# Patient Record
Sex: Male | Born: 1940 | Race: White | Hispanic: No | Marital: Married | Smoking: Former smoker
Health system: Southern US, Community
[De-identification: ages and names within clinical notes are randomized; demographics above are authoritative.]

## PROBLEM LIST (undated history)

## (undated) DIAGNOSIS — G43009 Migraine without aura, not intractable, without status migrainosus: Secondary | ICD-10-CM

## (undated) DIAGNOSIS — J189 Pneumonia, unspecified organism: Secondary | ICD-10-CM

## (undated) DIAGNOSIS — M810 Age-related osteoporosis without current pathological fracture: Secondary | ICD-10-CM

## (undated) DIAGNOSIS — K589 Irritable bowel syndrome without diarrhea: Secondary | ICD-10-CM

## (undated) DIAGNOSIS — Z8489 Family history of other specified conditions: Secondary | ICD-10-CM

## (undated) DIAGNOSIS — N183 Chronic kidney disease, stage 3 unspecified: Secondary | ICD-10-CM

## (undated) DIAGNOSIS — R000 Tachycardia, unspecified: Secondary | ICD-10-CM

## (undated) DIAGNOSIS — I10 Essential (primary) hypertension: Secondary | ICD-10-CM

## (undated) DIAGNOSIS — G4730 Sleep apnea, unspecified: Secondary | ICD-10-CM

## (undated) DIAGNOSIS — M1990 Unspecified osteoarthritis, unspecified site: Secondary | ICD-10-CM

## (undated) DIAGNOSIS — N400 Enlarged prostate without lower urinary tract symptoms: Secondary | ICD-10-CM

## (undated) DIAGNOSIS — G894 Chronic pain syndrome: Secondary | ICD-10-CM

## (undated) HISTORY — PX: SINUS EXPLORATION: SHX5214

## (undated) HISTORY — PX: CATARACT EXTRACTION: SUR2

## (undated) HISTORY — DX: Irritable bowel syndrome, unspecified: K58.9

## (undated) HISTORY — PX: MAXILLARY SINUS LIFT: SHX5206

## (undated) HISTORY — DX: Chronic pain syndrome: G89.4

## (undated) HISTORY — PX: HEMORRHOID SURGERY: SHX153

## (undated) HISTORY — DX: Migraine without aura, not intractable, without status migrainosus: G43.009

## (undated) HISTORY — DX: Age-related osteoporosis without current pathological fracture: M81.0

## (undated) HISTORY — PX: ELBOW FRACTURE SURGERY: SHX616

## (undated) HISTORY — DX: Unspecified osteoarthritis, unspecified site: M19.90

## (undated) HISTORY — PX: CERVICAL FUSION: SHX112

## (undated) HISTORY — PX: SPINE SURGERY: SHX786

## (undated) HISTORY — PX: OTHER SURGICAL HISTORY: SHX169

---
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
## 1998-01-17 ENCOUNTER — Ambulatory Visit (HOSPITAL_COMMUNITY): Admission: RE | Admit: 1998-01-17 | Discharge: 1998-01-17 | Payer: Self-pay | Admitting: *Deleted

## 1998-02-21 ENCOUNTER — Ambulatory Visit (HOSPITAL_COMMUNITY): Admission: RE | Admit: 1998-02-21 | Discharge: 1998-02-21 | Payer: Self-pay | Admitting: *Deleted

## 1999-03-13 ENCOUNTER — Encounter: Admission: RE | Admit: 1999-03-13 | Discharge: 1999-06-11 | Payer: Self-pay | Admitting: Anesthesiology

## 1999-08-03 ENCOUNTER — Encounter: Payer: Self-pay | Admitting: Specialist

## 1999-08-03 ENCOUNTER — Ambulatory Visit (HOSPITAL_COMMUNITY): Admission: RE | Admit: 1999-08-03 | Discharge: 1999-08-03 | Payer: Self-pay | Admitting: Specialist

## 1999-09-22 ENCOUNTER — Encounter: Payer: Self-pay | Admitting: Specialist

## 1999-09-29 ENCOUNTER — Encounter (INDEPENDENT_AMBULATORY_CARE_PROVIDER_SITE_OTHER): Payer: Self-pay | Admitting: Specialist

## 1999-09-29 ENCOUNTER — Inpatient Hospital Stay (HOSPITAL_COMMUNITY): Admission: RE | Admit: 1999-09-29 | Discharge: 1999-10-03 | Payer: Self-pay | Admitting: Specialist

## 1999-09-29 ENCOUNTER — Encounter: Payer: Self-pay | Admitting: Specialist

## 2002-05-10 ENCOUNTER — Encounter: Admission: RE | Admit: 2002-05-10 | Discharge: 2002-05-10 | Payer: Self-pay | Admitting: Family Medicine

## 2002-05-10 ENCOUNTER — Encounter: Payer: Self-pay | Admitting: Family Medicine

## 2002-12-16 ENCOUNTER — Encounter: Payer: Self-pay | Admitting: Emergency Medicine

## 2002-12-16 ENCOUNTER — Ambulatory Visit (HOSPITAL_COMMUNITY): Admission: RE | Admit: 2002-12-16 | Discharge: 2002-12-16 | Payer: Self-pay | Admitting: Emergency Medicine

## 2002-12-17 ENCOUNTER — Emergency Department (HOSPITAL_COMMUNITY): Admission: EM | Admit: 2002-12-17 | Discharge: 2002-12-17 | Payer: Self-pay | Admitting: Emergency Medicine

## 2002-12-22 ENCOUNTER — Encounter: Payer: Self-pay | Admitting: Orthopedic Surgery

## 2002-12-23 ENCOUNTER — Encounter: Payer: Self-pay | Admitting: Orthopedic Surgery

## 2002-12-23 ENCOUNTER — Observation Stay (HOSPITAL_COMMUNITY): Admission: RE | Admit: 2002-12-23 | Discharge: 2002-12-24 | Payer: Self-pay | Admitting: Orthopedic Surgery

## 2002-12-24 ENCOUNTER — Ambulatory Visit (HOSPITAL_COMMUNITY): Admission: RE | Admit: 2002-12-24 | Discharge: 2002-12-25 | Payer: Self-pay | Admitting: Otolaryngology

## 2003-05-09 ENCOUNTER — Encounter: Payer: Self-pay | Admitting: Family Medicine

## 2003-05-09 ENCOUNTER — Encounter: Admission: RE | Admit: 2003-05-09 | Discharge: 2003-05-09 | Payer: Self-pay | Admitting: Family Medicine

## 2003-06-17 ENCOUNTER — Encounter: Admission: RE | Admit: 2003-06-17 | Discharge: 2003-06-17 | Payer: Self-pay | Admitting: Orthopaedic Surgery

## 2003-06-17 ENCOUNTER — Encounter: Payer: Self-pay | Admitting: Orthopaedic Surgery

## 2003-09-09 ENCOUNTER — Ambulatory Visit (HOSPITAL_COMMUNITY): Admission: RE | Admit: 2003-09-09 | Discharge: 2003-09-10 | Payer: Self-pay | Admitting: Orthopaedic Surgery

## 2004-12-05 ENCOUNTER — Ambulatory Visit (HOSPITAL_COMMUNITY): Admission: RE | Admit: 2004-12-05 | Discharge: 2004-12-05 | Payer: Self-pay | Admitting: Gastroenterology

## 2006-03-25 ENCOUNTER — Ambulatory Visit (HOSPITAL_BASED_OUTPATIENT_CLINIC_OR_DEPARTMENT_OTHER): Admission: RE | Admit: 2006-03-25 | Discharge: 2006-03-25 | Payer: Self-pay | Admitting: Otolaryngology

## 2006-03-25 ENCOUNTER — Encounter (INDEPENDENT_AMBULATORY_CARE_PROVIDER_SITE_OTHER): Payer: Self-pay | Admitting: Specialist

## 2006-08-20 ENCOUNTER — Ambulatory Visit (HOSPITAL_BASED_OUTPATIENT_CLINIC_OR_DEPARTMENT_OTHER): Admission: RE | Admit: 2006-08-20 | Discharge: 2006-08-20 | Payer: Self-pay | Admitting: Otolaryngology

## 2006-09-01 ENCOUNTER — Ambulatory Visit: Payer: Self-pay | Admitting: Internal Medicine

## 2007-07-14 IMAGING — CR DG CHEST 2V
2 series · 2 of 2 positions shown · non-contrast
Comparison: [DATE].

CLINICAL DATA: Hemorrhoids.  Preoperative chest. 
 CHEST ? 2 VIEW:

[view not recorded (1 of 2)]
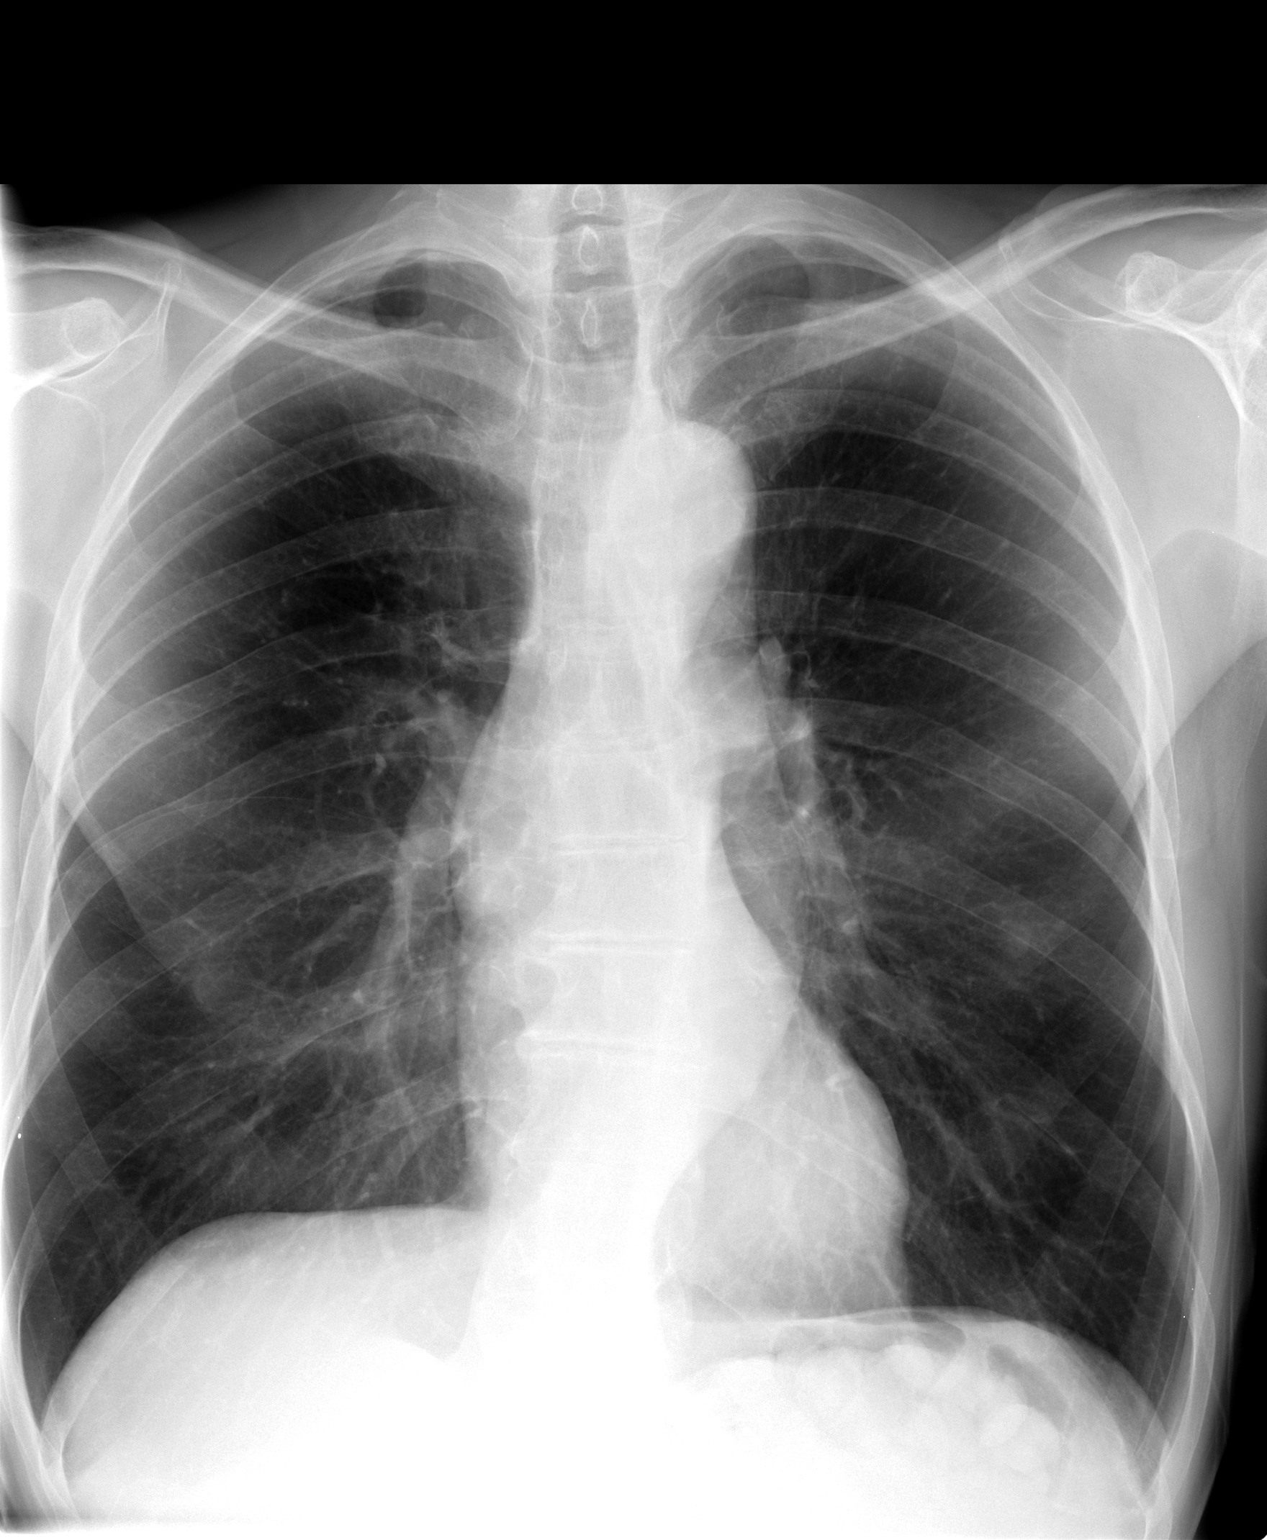

[view not recorded (2 of 2)]
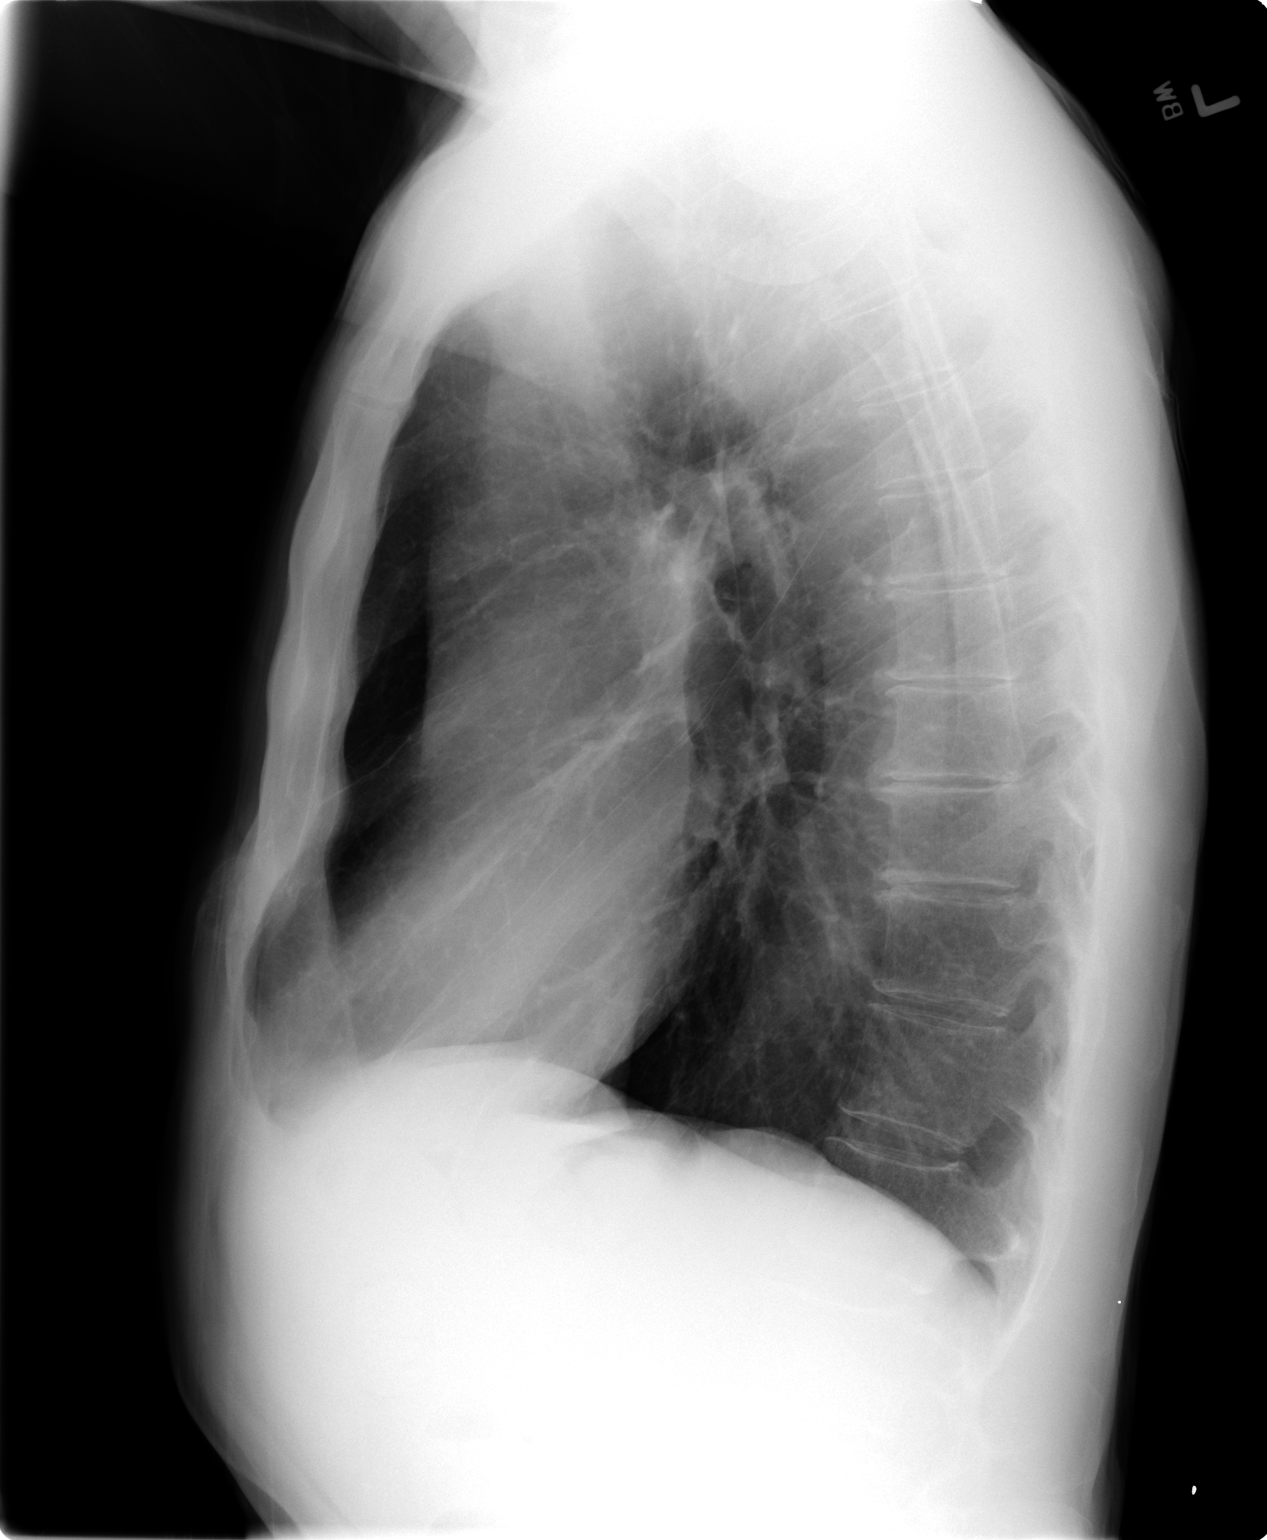

[2 of 2 positions shown; findings below may reference images not displayed]

FINDINGS: There are nipple shadows overlying the mid to lower lung zones.  The lungs are free of infiltrates.  The heart and mediastinal structures are normal.  There has been a previous anterior cervical fusion.
IMPRESSION: No evidence for active chest disease.

## 2007-07-15 ENCOUNTER — Encounter (INDEPENDENT_AMBULATORY_CARE_PROVIDER_SITE_OTHER): Payer: Self-pay | Admitting: General Surgery

## 2007-07-15 ENCOUNTER — Ambulatory Visit (HOSPITAL_COMMUNITY): Admission: RE | Admit: 2007-07-15 | Discharge: 2007-07-15 | Payer: Self-pay | Admitting: General Surgery

## 2009-05-14 ENCOUNTER — Encounter: Admission: RE | Admit: 2009-05-14 | Discharge: 2009-05-14 | Payer: Self-pay | Admitting: Orthopaedic Surgery

## 2011-01-16 NOTE — Op Note (Signed)
NAME:  Jesus Alvarez, Jesus Alvarez             ACCOUNT NO.:  [id number]   MEDICAL RECORD NO.:  [id number]          PATIENT TYPE:  AMB   LOCATION:  DAY                          FACILITY:  [facility]   PHYSICIAN:  Timothy E. Earlene Plater, M.D. DATE OF BIRTH:  08-14-1941   DATE OF PROCEDURE:  07/15/2007  DATE OF DISCHARGE:                               OPERATIVE REPORT   PREOPERATIVE DIAGNOSIS:  Prolapsing hemorrhoids.   PROCEDURE:  PPH hemorrhoidectomy.   SURGEON:  Timothy E. Earlene Plater, M.D.   ANESTHESIA:  General.   Mr. Merryfield is 49, otherwise healthy except for chronic pain syndrome  and use of narcotics.  He does have some difficulty with bowels, more  diarrhea than constipation, and he has developed prolapsing hemorrhoids  of a fourth-degree nature.  He has been seen and evaluated in the office  and offered various forms of treatment, and he is an ideal candidate for  PTH hemorrhoidectomy.  At the appropriate time, he has chosen now to  proceed with that operation.  He has been carefully informed.  He is  seen today and identified and the permit signed.   He is taken to the operating room and placed supine.  General LMA  anesthesia provided.  He is placed in lithotomy.  Perianal area  inspected, prepped and draped in the usual fashion.  Hemorrhoids were  prolapsing.  There were gently reduced.  The anus was dilated.  The anus  was injected round and about with Marcaine, epinephrine and Wydase, a  total 20 mL used.  This all massaged in well.  The anus dilated nicely.  The working operating anoscope placed and was easily manipulated.  A  pursestring suture of 2-0 Prolene was placed beginning anteriorly and  around the entirety of the rectal mucosa of a mucosal thickness only  without complication.  The anoscope removed.  The integrity of the  pursestring suture checked and then opened widely.  With the Ethicon  clear plastic operating anoscope in place, the tails of the suture were  brought  exteriorly.  The opened PTH stapling device was gently inserted  through the plastic anoscope into the rectal canal, and the head of the  stapling device popped through the pursestring suture which was then  gently tied around the shaft of the PTH stapling.  With proper traction  and positioning, PTH stapler was closed, at the same time, gentle  traction on the sutures, and it was fired, held, opened, held and then  removed.  There was one bleeding area anterior to this that was suture-  ligated with a 4-0 Vicryl immediately, and all bleeding was stopped.  The area was observed for a full 10 minutes, and no other areas were  bleeding.  The suture line was about 1-cm proximal to the dentate line.  There were no other complications.  After careful observation, a Gelfoam  pack was placed in the rectal canal and external dressing.  All counts  correct.  He tolerated it well and was removed to the recovery room in  good condition.   Written and verbal instructions given including Percocet #36, and  he  will be followed as an outpatient.     Timothy E. Earlene Plater, M.D.  Electronically Signed    TED/MEDQ  D:  07/15/2007  T:  07/16/2007  Job:  [id number]

## 2011-01-19 NOTE — Op Note (Signed)
NAME:  ZAKEE, DEERMAN NO.:  [id number]   MEDICAL RECORD NO.:  [id number]                   PATIENT TYPE:  OIB   LOCATION:  5731                                 FACILITY:  [facility]   PHYSICIAN:  Zola Button T. Lazarus Salines, M.D.              DATE OF BIRTH:  November 26, 1940   DATE OF PROCEDURE:  12/24/2002  DATE OF DISCHARGE:                                 OPERATIVE REPORT   PREOPERATIVE DIAGNOSIS:  Depressed right trimalar zygomatic fracture.   POSTOPERATIVE DIAGNOSIS:  Depressed right trimalar zygomatic fracture.   PROCEDURE:  Open reduction and internal fixation, right zygomatic fracture.   SURGEON:  Gloris Manchester. Lazarus Salines, M.D.   ANESTHESIA:  General orotracheal.   ESTIMATED BLOOD LOSS:  Minimal.   COMPLICATIONS:  None.   FINDINGS:  A posteriorly-depressed and counterclockwise-rotated fracture of  the right zygoma with fractures at the right frontozygomatic suture,  depressed fracture at the zygomatic arch, and a comminuted fracture along  the infraorbital rim.  The fracture was readily reduced and stable with  plate at the frontozygomatic suture and along the infraorbital rim.   DESCRIPTION OF PROCEDURE:  With the patient in a comfortable supine  position, general orotracheal anesthesia was induced without difficulty.  At  an appropriate level, the patient was placed in a slight reverse  Trendelenburg.  The head was rotated to the right for access to the right  eye region.  Xylocaine 1% with 1:100,000 epinephrine, 6 mL total, was  infiltrated into the right frontozygomatic suture region and also into the  lower lid and infraorbital rim region for intraoperative hemostasis.  Several minutes were allowed for this to take effect.   Taking care to avoid the visual axis of the eye, a 6-0 nylon temporary  tarsorrhaphy Frost stitch was applied in the standard fashion.   Beginning at the right frontozygomatic suture line, a 2 cm incision was  executed in the lateral  brow, beveled with the hair follicles, and carried  down through skin, orbicularis oculi muscle, and periosteum.  The fracture  was palpated and the incision was carried to the periosteum above and below  the fracture line.  The periosteum was elevated toward orbit and backed into  the temporal fossa.   A subciliary approach was planned to the lower lid.  The sharp incision was  made from just lateral to the medial inferior punctum to the lateral corner  of the eye and then a small (5 mm) extension into one of the wrinkles was  sharply executed.  Tenotomy scissors were used to elevate the skin from the  lid for approximately 4-5 mm vertical height.  The orbicularis oculi muscle  was penetrated laterally and a submuscular tunnel was executed.  A skin-  muscle flap was separated and retracted downward.  The dissection was  carried along the orbital septum with no exposure of periorbital fat.  This  was carried down to the  infraorbital rim.  There was a palpable-step-off.  The periosteum at the infraorbital rim was incised on both sides of the step-  off and the periosteum elevated down onto the face of the maxilla for access  to the bone.   At this point, the Joker elevator was passed near the temporal fossa and  secured bluntly under the zygomatic arch.  With lateral and anterior  traction, the position of the zygomatic bone was improved.  There was very  little change in the frontozygomatic suture, but the arch was elevated and  the infraorbital rim was brought laterally.  Upon mobilizing, it was  apparent that the medial aspect which had been exposed was, in fact, a 1.5  cm mobile fracture piece, and further dissection medially was required to  arrive at stable nasal bone to secure the infraorbital rim.  There was  slight rotation of the mobile intermediate piece.   At this point, an interrupted four-hole plate for the 1.5 mm TLS set was  slightly curved and placed along the  frontozygomatic suture.  It was  observed to contour nicely.  Holes were drilled and 4 and 5 mm screws were  placed without difficulty.   An eight-hole plate was fashioned through several iterations to fit along  the infraorbital rim and over onto the nasal bone.  It was secured initially  with two separate screws at the stable nasal bone.  Upon securing it in this  fashion, the joker elevator was once again used to mobilize the body of the  zygoma slightly laterally, allowing the intermediate piece of the  infraorbital rim to be brought into good alignment, and it was secured to  the plate with two additional screws.  Finally with the bone in good  position and relatively stable, two additional screws were secured to the  lateral mobile piece for a total of six screws in an eight-hole plate along  the infraorbital rim.  A good visual and cosmetic palpable reconfiguration  of the zygomatic bone was noted.  There was no significant bleeding.  Both  wounds were thoroughly irrigated.   The lateral brow incision was closed in the periosteal layer with 4-0  chromic and the orbicularis oculi layer with 4-0 chromic and in the skin  with a running simple 6-0 Ethilon in a cosmetic fashion.  The infraorbital  incision was closed with a periosteal layer of 4-0 chromic and then the lid  was draped back and closed in the subciliary incision with running simple  and interrupted simple sutures of 6-0 plain gut.  An excellent cosmetic  restoration of the eyelid was accomplished.  At this point the procedure was  completed.  The Frost stitch was removed and the eye was irrigated with  balanced salt solution.  Maxitrol ointment was applied to the incisions.  The patient was returned to anesthesia, awakened, extubated, and transferred  to recovery in stable condition.   COMMENT:  A 70 year old white male now approximately nine days status post a fall, sustaining injuries to both forearms and fracture with  depression of  the right zygoma, hence the indication for today's procedure.  Although the  patient had the option of having this as an outpatient procedure, he had his  forearm fracture plated and repaired  yesterday and had a significant pain intolerance.  He may choose to stay  overnight for pain management and observation.  Otherwise anticipate a  routine postoperative recovery with attention to analgesia, antibiosis, ice,  elevation, and  avoidance of nose blowing.                                               Gloris Manchester. Lazarus Salines, M.D.    KTW/MEDQ  D:  12/24/2002  T:  12/25/2002  Job:  [id number]   cc:   Brett Canales A. Cleta Alberts, M.D.  [address]  [place]  [place] [address]  Fax: [phone]   Ollen Gross, M.D.  [address]  [place]  [place] [address]  Fax: [phone]

## 2011-01-19 NOTE — Op Note (Signed)
NAME:  Jesus Alvarez, Jesus Alvarez NO.:  [id number]   MEDICAL RECORD NO.:  [id number]                   PATIENT TYPE:  OIB   LOCATION:  NA                                   FACILITY:  [facility]   PHYSICIAN:  Ollen Gross, M.D.                 DATE OF BIRTH:  1941/02/08   DATE OF PROCEDURE:  12/23/2002  DATE OF DISCHARGE:                                 OPERATIVE REPORT   PREOPERATIVE DIAGNOSES:  Right radial head fracture.   POSTOPERATIVE DIAGNOSES:  Right radial head fracture.   PROCEDURE:  Excision of fracture fragment, right radial head.   SURGEON:  Ollen Gross, M.D.   ASSISTANT:  Alexzandrew L. Julien Girt, P.A.   ANESTHESIA:  General.   ESTIMATED BLOOD LOSS:  Minimal.   DRAINS:  None.   COMPLICATIONS:  None.   TOURNIQUET TIME:  38 min at 250 mmHg.   CONDITION:  Stable to recovery.   BRIEF CLINICAL NOTE:  The patient is a 70 year old male who had a very bad  fall approximately one week ago, injuring his left wrist, right elbow and  sustaining an orbital fracture on the right.  He had xrays of the elbow,  demonstrating a displaced radial head fracture.  This looked to be one large  fragment that was causing articular step-off.  He presents now for possible  open reduction internal fixation versus fragment excision.   PROCEDURE IN DETAIL:  After the successful administration of general  anesthetic, a tourniquet was placed high on the right upper extremity and  right upper extremity first prepped and draped in the usual sterile fashion.  A standard posterolateral incision was made from the lateral condyle of the  humerus down to the area over the radial head.  The skin was cut with a #10  blade through subcutaneous tissue, to the level of the retinaculum (which is  incised through tourniquet into the joint).  There was a large hematoma in  his joint.  Inspection of the radial head shows that the majority is not  involved, but there is a significant  amount of comminution at the most  medial aspect of the radial head, which is the portion that was the most  difficult to get to.  I attempted to place two cannulating screws over guide  wires, but it was so comminuted that the screw placement was making the  comminution even worse.  I ended up just excising the fragment and there was  still at least 60% of the joint surface remaining.  Prior to excising the  fragment, there was significant crepitus on the range of motion, and  incomplete extension.  Once we excised the fragment then the crepitus was  gone, and full extension was achieved as well as full forearm pronation and  supination.  Thus, there was no more mechanical block to his range of  motion.  I then copiously irrigated the elbow  joint, and repaired the  lateral collateral ligament and retinaculum with interrupted #1 Vicryl.  Again, with placement through a range of motion and there was no crepitus,  and there is full motion.  The tourniquet was then released, full time of 38  min.  Minor bleeding was stopped with cautery.  The subcutaneous tissue was  closed with interrupted 2-0 Vicryl and subcuticular running 4-0 Monocryl.  The incision was cleaned and dried, Steri-Strips and bulky, sterile  dressings applied.  He was placed into a sugar tong elbow splint.  He was  awakened and transported to recovery room in stable condition.                                               Ollen Gross, M.D.    FA/MEDQ  D:  12/23/2002  T:  12/23/2002  Job:  [id number]

## 2011-01-19 NOTE — Procedures (Signed)
NAME:  Jesus Alvarez, Jesus Alvarez NO.:  [id number]   MEDICAL RECORD NO.:  [id number]          PATIENT TYPE:  OUT   LOCATION:  SLEEP CENTER                 FACILITY:  [facility]   PHYSICIAN:  Clinton D. Maple Hudson, MD, FCCP, FACPDATE OF BIRTH:  Jan 25, 1941   DATE OF STUDY:  08/20/2006                            NOCTURNAL POLYSOMNOGRAM   INDICATION FOR STUDY:  Hypersomnia with sleep apnea.   EPWORTH SLEEPINESS SCORE:  7/24.   BMI:  17.8.   WEIGHT:  135 pounds.   HOME MEDICATIONS:  Are charted and significantly include narcotic and  non-narcotic pain medications and sedating medications such as  promethazine and diazepam.  Patient has a history of back and leg pain.   MEDICATIONS:   SLEEP ARCHITECTURE:  Short total sleep time 278 minutes with sleep  deficiency 69%.  Stage 1 was 18%, stage II 78%, stages III and IV were  absent, REM 4% of total sleep time.  Sleep latency 8 minutes, REM  latency 91 minutes, awake after sleep onset 119 minutes, arousal index  14.6.  Diazepam and Topamax were taken at 2130 hours, hydrocodone with  APAP and tramadol were taken at 2200 hours.  Promethazine was taken at  2230 hours.  Pepcid AC and simethicone were taken at 0258 hours.   RESPIRATORY DATA:  Split study protocol.  Apnea-hypopnea index (AHI,  RDI) 16.3 obstructing events per hour, indicating moderate obstructive  sleep apnea/hypopnea syndrome before CPAP.  This included 31 obstructive  apneas and 4 hypopneas before CPAP.  He slept only supine and all events  were recorded in that position.  REM AHI 11.4 per hour.  CPAP was  titrated to 10 cwp, AHI 0 per hour.  A medium ResMed Ultra Mirage full  face mask was used with a heated humidifier.   OXYGEN DATA:  Moderate snoring with oxygen desaturation to a nadir of  84%.  Mean oxygen saturation after CPAP control was 98% on room air.   CARDIAC DATA:  Normal sinus rhythm.   MOVEMENT-PARASOMNIA:  No significant limb jerks or other movement  disturbance were noted.   IMPRESSIONS-RECOMMENDATIONS:  1. Moderate obstructive sleep apnea/hypopnea syndrome, apnea-hypopnea      index 16.3 per hour with all sleep events and sleep time recorded      while supine.  Moderate snoring with oxygen desaturation to a nadir      of 84%.  2. Successful continuous positive airway pressure titration to 10 cwp,      apnea-hypopnea index 0 per hour.  A medium ResMed Ultra Mirage full      face mask was used with a heated humidifier.  3. The patient has a history of significant hip and back pain      affecting sleep and this may relate to the number of medications      taken during the sleep study.  Also note that the patient used      Orajel and a Breathe Right Strip throughout the study.      Clinton D. Maple Hudson, MD, FCCP, FACP  Diplomate, Biomedical engineer of Sleep Medicine  Electronically Signed     CDY/MEDQ  D:  09/01/2006 10:07:56  T:  09/01/2006 15:48:27  Job:  [id number]

## 2011-01-19 NOTE — Op Note (Signed)
NAME:  CHANTZ, MONTEFUSCO NO.:  [id number]   MEDICAL RECORD NO.:  [id number]                   PATIENT TYPE:  OIB   LOCATION:  2899                                 FACILITY:  [facility]   PHYSICIAN:  Sharolyn Douglas, M.D.                     DATE OF BIRTH:  04-12-1941   DATE OF PROCEDURE:  09/09/2003  DATE OF DISCHARGE:                                 OPERATIVE REPORT   PREOPERATIVE DIAGNOSIS:  Cervical spondylotic radiculopathy.   POSTOPERATIVE DIAGNOSIS:  Cervical spondylotic radiculopathy.   PROCEDURE:  1. Anterior cervical discectomy C3-C4 and C4-C5.  2. Anterior cervical arthrodesis C3-C4 and C4-C5 with placement of two     allograft prosthesis spacers packed with local autogenous bone graft.  3. Anterior cervical plating C3 through C5 utilizing the Trinica system.   SURGEON:  Sharolyn Douglas, M.D.   ASSISTANT:  Verlin Fester, P.A.   ANESTHESIA:  General endotracheal anesthesia.   COMPLICATIONS:  None.   INDICATIONS FOR PROCEDURE:  The patient is a 70 year old male with a long  history of progressive neck and left upper extremity pain.  Plain  radiographs show multilevel degenerative changes most severe at C3-C4 and C4-  C5.  MRI scan again demonstrates foraminal narrowing at C3-C4 on the left.  At C4-C5, there is moderate central stenosis as well as bilateral foraminal  stenosis.  There are degenerative changes at the C5-C6 and C6-C7 levels  without any significant central or foraminal narrowing.  We reviewed  treatment options.  He had a second opinion with Dr. Barnett Abu who  recommended anterior cervical discectomy at C3-C4 and C4-C5.  The patient  understands that he has multilevel degenerative changes in his neck.  He  will probably continue to have some degree of neck pain.  In addition, his  symptoms have been very long-standing and may not respond to decompression  and fusion.  Nevertheless, he has exhausted all his conservative treatment  options, is frustrated, and is willing to accept the risks of surgery in  hopes of improving his symptoms.   PROCEDURE:  The patient was properly identified in the holding area, was  taken to the operating room.  He underwent general endotracheal anesthesia  without difficulty.  He was given prophylactic IV antibiotics.  He was  carefully positioned on the operating table with the Mayfield head rest, his  neck in slight extension.  5 pounds of halter traction was applied.  The  neck was prepped and draped in the usual sterile fashion.  A 3.5 cm incision  was made on the left side in a transverse fashion at the level of the  thyroid cartilage.  Dissection was carried sharply through the platysma.  The interval between the SCM and the strap muscles medially was developed  bluntly down to the prevertebral space.  The first disc space identified was  C5-C6 with large anterior osteophytes.  X-ray taken to confirm this  location.  We then worked up exposing the C4-C5 and C3-C4 levels.  The  esophagus, trachea, and carotid sheath were identified and protected at all  times.  The longus colli muscle was elevated out over the C3-C4 and C4-C5  disc spaces.  Deep Shadowline retractor placed.  Sharp annulotomy carried  out at C3-C4.  We then performed a similar procedure at C4-C5.  The  microscope was prepped and draped and brought into the field.  The remainder  of the operation is done under microscopic illumination magnification.  The  cartilaginous endplates were scraped of all disc material.  The discectomy  was carried back to the posterior longitudinal ligament.  At C3-C4, we noted  degenerative disc material.  There was moderate posterior vertebral spurring  as well as uncovertebral overgrowth.  At C4-C5, there was severe disc space  narrowing.  The uncovertebral joints were hypertrophied.  High speed bur was  used to remove the cartilaginous endplates as well as take down the large   uncovertebral spurs.  A 2 mm Kerrison was used to undercut the vertebral  margins and perform wide foraminotomies bilaterally.  When we were done, the  spinal cord was decompressed.  At C4-C5, we took down the posterior  longitudinal ligament from side to side.  At C3-C4, the posterior  longitudinal ligament was taken down only on the symptomatic left side.  The  disc spaces were irrigated, bleeding was controlled with bipolar  electrocautery and Gelfoam.  We placed a 7 mm Nuvasive allograft prosthesis  spacer packed with local autogenous bone graft from the bur shavings at C3-  C4.  At the C4-C5 level, a 6 mm graft was utilized, again packed with the  local autogenous bone graft material.  The grafts were carefully counter  sunk 1 mm.  We confirmed there was room posteriorly with a blunt probe.  We  then placed a Trinica plate with six 14 mm screws.  His bone quality was  soft.  The screws did achieve a good purchase.  We insured the locking  mechanism engaged.  Interoperative x-ray was taken and showed good  positioning of the graft and instrumentation.  The wound was irrigated.  The  Penrose drain was left in place.  The esophagus, trachea, and carotid sheath  were examined and there were no apparent injuries.  The platysma was closed  with an interrupted 2-0 Vicryl, the subcutaneous layer closed with a 3-0  Vicryl, followed by a 4-0 subcuticular Vicryl suture on the skin.  Benzoin  and Steri-Strips were applied.  A sterile dressing was placed.  A soft  collar was placed.  The patient was extubated without difficulty,  transferred to the recovery room, able to move his upper and lower  extremities, in good condition.                                               Sharolyn Douglas, M.D.    MC/MEDQ  D:  09/09/2003  T:  09/09/2003  Job:  [id number]

## 2011-01-19 NOTE — Op Note (Signed)
NAME:  Jesus Alvarez, Jesus Alvarez             ACCOUNT NO.:  [id number]   MEDICAL RECORD NO.:  [id number]          PATIENT TYPE:  AMB   LOCATION:  DSC                          FACILITY:  [facility]   PHYSICIAN:  Karol T. Lazarus Salines, M.D. 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   PROCEDURE PERFORMED:  Right canine fossa maxillary endoscopy with mini  Caldwell-Luc procedure and evacuation of fungus ball and partial removal of  maxillary sinus contents.  Nasal endoscopy with lysis of a synechial band  across the antrostomy.   SURGEON:  Gloris Manchester. Lazarus Salines, M.D.   ANESTHESIA:  General orotracheal.   BLOOD LOSS:  Minimal.   COMPLICATIONS:  None.   FINDINGS:  A roughly 1 x 2 cm blob of inspissated debris in the right antrum  consistent with a fungus ball.  Somewhat granular and inflamed inferior and  anterior face mucosa which by frozen section was negative for neoplasm or  malignancy.  A thin synechial band across the anterior aspect of the  surgical antrostomy.   With the patient in the comfortable supine position, general orotracheal  anesthesia was gently induced.  At an appropriate level, the patient was  placed in a slight sitting position with the head rotated towards the right  for access to the right nose.  Nasal vibrissae were trimmed.  A saline  moistened throat pack was placed.  The patient had received Afrin spray in  the preoperative area.   The zero degree and then the 30 degree nasal endoscopes were introduced into  the nose and with the middle turbinate slightly medialized, the scopes were  placed up into the middle meatus and observation down into the antrum  revealed the presence of a residual fungus ball.  Even using the 70 degree  nasal  endoscope, it was impossible to instrument the severe angulation into  the sinus to grasp this.  A Caldwell-Luc procedure was elected.   The upper right gingival buccal sulcus had already been injected with 1%  Xylocaine with 1:100,000 epinephrine as part of the prep, 6 mL total.  At  this point, a 1.5 cm incision was sharply executed and carried down to the  periosteum of the face of the maxilla which was elevated slightly in all  directions.  The maxillary sinus trocar was placed and drilled into the  sinus.  Through this opening, the zero degree and 30 degree scopes were  introduced.  The fungus ball was identified but could not be evacuated.  The  trocar was removed and the opening was slightly enlarged approximately 1.5  cm in all directions.  Through this, the fungus ball could be seen with  direct visualization and endoscopic visualization and was evacuated  piecemeal and sent for a specimen.  The mucosa of the floor the nose in the  anterior wall adjacent to the surgical opening was thickened and somewhat  granular in appearance.  Using a [facility], the elevation was  accomplished circumferentially on the inner surface of the maxillary sinus  and carried into the sinus.  This mucosa was delivered piecemeal for frozen  section and permanent interpretation.  Care was taken to carefully clean  circumferentially.  Hemostasis was spontaneous.  Upon irrigating the sinus  clear, using the zero, 30, and 70 degree scopes, no additional findings were  noted.  Under direct vision using the zero degree scope and a Wilde punch  forceps, the synechial band at the antrostomy was opened.  The sinus was  thoroughly irrigated and hemostasis was observed.  The mucosal incision was  closed with interrupted 3-0 chromic sutures.   By this time, the frozen section had returned as negative for any kind of  neoplasm.  No further work was felt appropriate.  The nasal cavity was  carefully cleaned  and the oral cavity was suctioned clear as well.  The  throat pack was removed.  The patient was returned to Anesthesia, awakened,  extubated, and transferred to recovery in stable condition.   COMMENT:  A 70 year old white male status post right endoscopic antrostomy  and anterior ethmoidectomy has had persistence of colorful drainage, slight  mid facial leaking, and a small amount of blood with office findings of a  fungus ball which could not be evacuated with the irrigation hence the  indication for today's procedure.  Will await the permanent pathology  report.  For now, will emphasize ice, elevation, analgesia, antibiosis,  avoidance of nose blowing, and oral hygiene made and nasal hygiene measures.  I will call him with the permanent pathology report in 2-3 days.  I will see  him back in 2 weeks, sooner as needed.      Gloris Manchester. Lazarus Salines, M.D.  Electronically Signed     KTW/MEDQ  D:  03/25/2006  T:  03/25/2006  Job:  [id number]   cc:   Teena Irani. Arlyce Dice, M.D.  Fax: [phone]   Stan Head. Cleta Alberts, M.D.  Fax: [phone]

## 2011-06-12 LAB — BASIC METABOLIC PANEL
CO2: 28
Calcium: 9.7
Creatinine, Ser: 0.95
GFR calc Af Amer: >60
Potassium: 5
Sodium: 137

## 2011-06-12 LAB — DIFFERENTIAL
Eosinophils Absolute: 0 — ABNORMAL LOW
Eosinophils Relative: 1
Monocytes Relative: 10
Neutro Abs: 2.4
Neutrophils Relative %: 55

## 2011-06-12 LAB — CBC
HCT: 38.5 — ABNORMAL LOW
Hemoglobin: 13.5
MCHC: 35.1
Platelets: 190
RBC: 4.23
RDW: 12.9

## 2012-12-24 ENCOUNTER — Telehealth: Payer: Self-pay

## 2012-12-24 MED ORDER — ACETAMINOPHEN-CODEINE #3 300-30 MG PO TABS: 1.0000 | ORAL_TABLET | Freq: Four times a day (QID) | ORAL | Status: DC | PRN

## 2012-12-24 NOTE — Telephone Encounter (Signed)
Patient called front desk stating he has seen Dr Anne Hahn in the past and would like him to write a rx for Tylenol with Codeine.  Patient states he is completley out of meds and needs refills.  Would you like to prescribe?  Please advise.  Thank you.

## 2012-12-24 NOTE — Telephone Encounter (Signed)
I called the patient. The patient has not gotten a Rx for Tylenol #3 since 8/12. He has been followed by a pain center, but he has since been released. I will write a small rx for this medication.

## 2013-03-03 ENCOUNTER — Other Ambulatory Visit: Payer: Self-pay | Admitting: Neurology

## 2013-03-20 ENCOUNTER — Encounter: Payer: Self-pay | Admitting: Neurology

## 2013-03-20 ENCOUNTER — Other Ambulatory Visit: Payer: Self-pay

## 2013-03-20 MED ORDER — DIAZEPAM 10 MG PO TABS: 5.0000 mg | ORAL_TABLET | Freq: Two times a day (BID) | ORAL | Status: DC

## 2013-03-20 NOTE — Telephone Encounter (Signed)
Dr Anne Hahn is out of the office, Dr Vickey Huger [facility]

## 2013-03-20 NOTE — Telephone Encounter (Signed)
This encounter was created in error - please disregard.

## 2013-04-22 ENCOUNTER — Other Ambulatory Visit: Payer: Self-pay | Admitting: Neurology

## 2013-04-23 ENCOUNTER — Other Ambulatory Visit: Payer: Self-pay

## 2013-04-23 MED ORDER — DIAZEPAM 10 MG PO TABS: 5.0000 mg | ORAL_TABLET | Freq: Two times a day (BID) | ORAL | Status: DC

## 2013-04-23 NOTE — Telephone Encounter (Signed)
Rx signed and faxed.

## 2013-06-13 ENCOUNTER — Other Ambulatory Visit: Payer: Self-pay | Admitting: Neurology

## 2013-08-25 ENCOUNTER — Ambulatory Visit (INDEPENDENT_AMBULATORY_CARE_PROVIDER_SITE_OTHER): Payer: Medicare Other | Admitting: Family Medicine

## 2013-08-25 VITALS — BP 110/74 | HR 86 | Temp 98.3°F | Resp 18 | Ht 71.5 in | Wt 139.0 lb

## 2013-08-25 DIAGNOSIS — K120 Recurrent oral aphthae: Secondary | ICD-10-CM

## 2013-08-25 MED ORDER — ACYCLOVIR 400 MG PO TABS: 400.0000 mg | ORAL_TABLET | Freq: Three times a day (TID) | ORAL | Status: DC

## 2013-08-25 NOTE — Progress Notes (Signed)
Subjective:    Patient ID: Jesus Alvarez, male    DOB: [birth year], 72 y.o.   MRN: [id number] This chart was scribed for Elvina Sidle, MD by Valera Castle, ED Scribe. This patient was seen in room 12 and the patient's care was started at 8:40 PM.  Chief Complaint  Patient presents with   blisters in mouth    HPI Jesus Alvarez is a 72 y.o. male who presents to the [facility] complaining of gradually worsening, small blisters to his mouth, onset 1 month ago. He reports the blisters look like white dots, that are scattered around his gums, bilateral tongue, as well as the back of his throat. He also reports a small boil over the back of his throat. He reports a h/o fever blisters. He states usually the blisters are on the outside of his mouth, but states that for the past month his blisters have ended up on the inside. He reports h/o fall and "busting" up the ride side of his face, resulting in 3 surgeries, including dentures to the right side of his mouth. He reports having an implant on the left side of his jaw, but states there is nothing to attach to on the right side for an implant. He reports h/o warped bite, and states he often bites himself. He reports applying Kenalog cream, with some relief. He reports he still has some Kenalog left. He wants to know if the blisters are still fever blisters. He is concerned about the medication damaging the hardware in his mouth. He denies any other symptoms.   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   [facility]  PCP - No primary provider on file.  There are no active problems to display for this patient.  Past Medical History  Diagnosis Date   Arthritis    Past Surgical History  Procedure Laterality Date   Spine surgery     Allergies  Allergen Reactions   Nsaids    Prior to Admission medications   Medication Sig Start Date End Date Taking? Authorizing Provider  acetaminophen-codeine (TYLENOL #3) 300-30 MG  per tablet Take 1 tablet by mouth every 6 (six) hours as needed for pain (Must last 28 days.). 12/24/12   York Spaniel, MD  diazepam (VALIUM) 10 MG tablet Take 0.5 tablets (5 mg total) by mouth 2 (two) times daily. 04/23/13   York Spaniel, MD  lidocaine (XYLOCAINE) 2 % solution USE AS DIRECTED 03/03/13   York Spaniel, MD  topiramate (TOPAMAX) 50 MG tablet TAKE 1 TABLET AT NIGHT 06/13/13   York Spaniel, MD   History reviewed. No pertinent family history. History   Social History   Marital Status: Married    Spouse Name: N/A    Number of Children: N/A   Years of Education: N/A   Occupational History   Not on file.   Social History Main Topics   Smoking status: Never Smoker    Smokeless tobacco: Not on file   Alcohol Use: Not on file   Drug Use: Not on file   Sexual Activity: Not on file   Other Topics Concern   Not on file   Social History Narrative   No narrative on file    Review of Systems  Constitutional: Negative for fever.  HENT: Positive for mouth sores (blisters over gums, tongue, back of throat).       Objective:   Physical Exam  BP 110/74   Pulse 86  Temp(Src) 98.3 F (36.8 C) (Oral)   Resp 18   Ht 5' 11.5" (1.816 m)   Wt 139 lb (63.05 kg)   BMI 19.12 kg/m2   SpO2 98%  Nursing note and vitals reviewed. Constitutional: Pt is oriented to person, place, and time. Pt appears well-developed and well-nourished. No distress.  HENT: A couple small vesicles in mouth  Head: Normocephalic and atraumatic.  Eyes: EOM are normal.  Neck: Neck supple. No tracheal deviation present.  Cardiovascular: Normal rate. Pulmonary/Chest: Effort normal. Abdominal: Soft. Bowel sounds are normal. There is no tenderness. There is no rebound and no guarding.  Musculoskeletal: Normal range of motion.  Neurological: Pt is alert and oriented to person, place, and time.  Skin: Skin is warm and dry.  Psychiatric: Pt has a normal mood and affect. Pt's behavior is  normal.      Assessment & Plan:   Aphthous stomatitis - Plan: acyclovir (ZOVIRAX) 400 MG tablet Continue the kenalog and also start probiotics Signed, Elvina Sidle, MD      I personally performed the services described in this documentation, which was scribed in my presence. The recorded information has been reviewed and is accurate.

## 2013-08-25 NOTE — Patient Instructions (Signed)
Probiotics to start Continue the kenalog

## 2013-10-05 ENCOUNTER — Encounter: Payer: Self-pay | Admitting: Neurology

## 2013-10-12 ENCOUNTER — Ambulatory Visit (INDEPENDENT_AMBULATORY_CARE_PROVIDER_SITE_OTHER): Payer: Medicare Other | Admitting: Neurology

## 2013-10-12 ENCOUNTER — Telehealth: Payer: Self-pay | Admitting: Neurology

## 2013-10-12 ENCOUNTER — Encounter: Payer: Self-pay | Admitting: Neurology

## 2013-10-12 ENCOUNTER — Encounter (INDEPENDENT_AMBULATORY_CARE_PROVIDER_SITE_OTHER): Payer: Self-pay

## 2013-10-12 VITALS — BP 137/83 | HR 68 | Wt 140.0 lb

## 2013-10-12 DIAGNOSIS — G43009 Migraine without aura, not intractable, without status migrainosus: Secondary | ICD-10-CM | POA: Insufficient documentation

## 2013-10-12 HISTORY — DX: Migraine without aura, not intractable, without status migrainosus: G43.009

## 2013-10-12 MED ORDER — LIDOCAINE VISCOUS 2 % MT SOLN: 1.0000 mL | Freq: Four times a day (QID) | OROMUCOSAL | Status: DC | PRN

## 2013-10-12 MED ORDER — DIAZEPAM 10 MG PO TABS: 5.0000 mg | ORAL_TABLET | Freq: Two times a day (BID) | ORAL | Status: DC

## 2013-10-12 MED ORDER — ACETAMINOPHEN-CODEINE #3 300-30 MG PO TABS: 1.0000 | ORAL_TABLET | Freq: Four times a day (QID) | ORAL | Status: DC | PRN

## 2013-10-12 MED ORDER — LIDOCAINE VISCOUS 2 % MT SOLN: 1.0000 mL | Freq: Four times a day (QID) | OROMUCOSAL | Status: AC | PRN

## 2013-10-12 NOTE — Patient Instructions (Signed)
Migraine Headache A migraine headache is an intense, throbbing pain on one or both sides of your head. A migraine can last for 30 minutes to several hours. CAUSES  The exact cause of a migraine headache is not always known. However, a migraine may be caused when nerves in the brain become irritated and release chemicals that cause inflammation. This causes pain. Certain things may also trigger migraines, such as:  Alcohol.  Smoking.  Stress.  Menstruation.  Aged cheeses.  Foods or drinks that contain nitrates, glutamate, aspartame, or tyramine.  Lack of sleep.  Chocolate.  Caffeine.  Hunger.  Physical exertion.  Fatigue.  Medicines used to treat chest pain (nitroglycerine), birth control pills, estrogen, and some blood pressure medicines. SIGNS AND SYMPTOMS  Pain on one or both sides of your head.  Pulsating or throbbing pain.  Severe pain that prevents daily activities.  Pain that is aggravated by any physical activity.  Nausea, vomiting, or both.  Dizziness.  Pain with exposure to bright lights, loud noises, or activity.  General sensitivity to bright lights, loud noises, or smells. Before you get a migraine, you may get warning signs that a migraine is coming (aura). An aura may include:  Seeing flashing lights.  Seeing bright spots, halos, or zig-zag lines.  Having tunnel vision or blurred vision.  Having feelings of numbness or tingling.  Having trouble talking.  Having muscle weakness. DIAGNOSIS  A migraine headache is often diagnosed based on:  Symptoms.  Physical exam.  A CT scan or MRI of your head. These imaging tests cannot diagnose migraines, but they can help rule out other causes of headaches. TREATMENT Medicines may be given for pain and nausea. Medicines can also be given to help prevent recurrent migraines.  HOME CARE INSTRUCTIONS  Only take over-the-counter or prescription medicines for pain or discomfort as directed by your  health care provider. The use of long-term narcotics is not recommended.  Lie down in a dark, quiet room when you have a migraine.  Keep a journal to find out what may trigger your migraine headaches. For example, write down:  What you eat and drink.  How much sleep you get.  Any change to your diet or medicines.  Limit alcohol consumption.  Quit smoking if you smoke.  Get 7 9 hours of sleep, or as recommended by your health care provider.  Limit stress.  Keep lights dim if bright lights bother you and make your migraines worse. SEEK IMMEDIATE MEDICAL CARE IF:   Your migraine becomes severe.  You have a fever.  You have a stiff neck.  You have vision loss.  You have muscular weakness or loss of muscle control.  You start losing your balance or have trouble walking.  You feel faint or pass out.  You have severe symptoms that are different from your first symptoms. MAKE SURE YOU:   Understand these instructions.  Will watch your condition.  Will get help right away if you are not doing well or get worse. Document Released: 08/20/2005 Document Revised: 06/10/2013 Document Reviewed: 04/27/2013 [employer] Patient Information 2014 [employer].  

## 2013-10-12 NOTE — Progress Notes (Signed)
Reason for visit: Headache  Jesus Alvarez is an 73 y.o. male  History of present illness:  Jesus Alvarez is a 73 year old right-handed white male with a history of migraine headache. The patient indicates that he has done relatively well with his headaches on Topamax taking 50 mg at night. The patient will average have about one headache a month. The patient will take Tylenol No. 3 for the headache. The patient has viscous lidocaine he takes for his left nasal discomfort. The patient also takes diazepam for his left neck and shoulder discomfort. The patient takes 5 mg twice daily, but he wants to cut back to take the medication only every other day. The patient continues to have his chronic abdominal discomfort. The etiology of the discomfort is not clear. The patient returns for an evaluation.  Past Medical History  Diagnosis Date  . Arthritis     DEGENERATIVE  . Chronic pain syndrome   . Osteoporosis   . IBS (irritable bowel syndrome)   . Migraine without aura, without mention of intractable migraine without mention of status migrainosus 10/12/2013    Past Surgical History  Procedure Laterality Date  . Spine surgery    . Elbow fracture surgery Right   . Sinus exploration      X3  . Hemorrhoid surgery    . Lumbosacral surgery      Family History  Problem Relation Age of Onset  . Arthritis Sister   . Parkinsonism Brother   . Arthritis Sister     Social history:  reports that he has quit smoking. He has never used smokeless tobacco. He reports that he does not drink alcohol or use illicit drugs.    Allergies  Allergen Reactions  . Nsaids     Medications:  Current Outpatient Prescriptions on File Prior to Visit  Medication Sig Dispense Refill  . acyclovir (ZOVIRAX) 400 MG tablet Take 1 tablet (400 mg total) by mouth 3 (three) times daily.  15 tablet  3  . Calcium Carbonate-Vitamin D (CALTRATE 600+D) 600-400 MG-UNIT per tablet Take 1 tablet by mouth daily.      .  tamsulosin (FLOMAX) 0.4 MG CAPS capsule Take 0.4 mg by mouth daily.      Marland Kitchen. topiramate (TOPAMAX) 50 MG tablet TAKE 1 TABLET AT NIGHT  90 tablet  1  . traMADol (ULTRAM) 50 MG tablet Take 50 mg by mouth every 6 (six) hours as needed.       No current facility-administered medications on file prior to visit.    ROS:  Out of a complete 14 system review of symptoms, the patient complains only of the following symptoms, and all other reviewed systems are negative.  Appetite change, decreased appetite Neck pain, neck stiffness, difficulty swallowing Cold intolerance Abdominal pain, rectal pain Mild sleep apnea Back pain, walking difficulties, joint pains  Blood pressure 137/83, pulse 68, weight 140 lb (63.504 kg).  Physical Exam  General: The patient is alert and cooperative at the time of the examination.  Skin: No significant peripheral edema is noted.   Neurologic Exam  Mental status: The patient is oriented x 3.  Cranial nerves: Facial symmetry is present. Speech is normal, no aphasia or dysarthria is noted. Extraocular movements are full. Visual fields are full.  Motor: The patient has good strength in all 4 extremities.  Sensory examination: Soft touch sensation on the face, arms, and legs is symmetric.  Coordination: The patient has good finger-nose-finger and heel-to-shin bilaterally.  Gait and station:  The patient has a normal gait. Tandem gait is normal. Romberg is negative. No drift is seen.  Reflexes: Deep tendon reflexes are symmetric.   Assessment/Plan:  1. Migraine headache  2. Chronic abdominal, back pain  The patient will continue his current medications but the Topamax, diazepam, and Tylenol No. 3.  A prescription was also given for the viscous lidocaine. The patient will followup through this office in one year.  [name] MD 10/12/2013 7:10 PM  [facility] [address] [place], [place] [address]  Phone  [phone] Fax [phone]

## 2013-10-12 NOTE — Telephone Encounter (Signed)
The call from the pharmacy is noted, I will change the volume of the prescription in the computer.

## 2013-10-12 NOTE — Telephone Encounter (Signed)
[facility] and they wanted the doctor to know the Viscous lidocaine 2% only comes in bottles, and they can't break them down into 20ml.  But directions will stay the same.

## 2013-12-09 ENCOUNTER — Other Ambulatory Visit: Payer: Self-pay | Admitting: Neurology

## 2014-04-23 ENCOUNTER — Other Ambulatory Visit: Payer: Self-pay | Admitting: Neurology

## 2014-04-26 ENCOUNTER — Other Ambulatory Visit: Payer: Self-pay | Admitting: Neurology

## 2014-07-21 ENCOUNTER — Encounter: Payer: Self-pay | Admitting: Neurology

## 2014-07-27 ENCOUNTER — Encounter: Payer: Self-pay | Admitting: Neurology

## 2014-10-12 ENCOUNTER — Ambulatory Visit: Payer: Medicare Other | Admitting: Neurology

## 2014-10-14 ENCOUNTER — Ambulatory Visit (INDEPENDENT_AMBULATORY_CARE_PROVIDER_SITE_OTHER): Payer: Medicare Other | Admitting: Neurology

## 2014-10-14 ENCOUNTER — Encounter: Payer: Self-pay | Admitting: Neurology

## 2014-10-14 VITALS — BP 117/74 | HR 64 | Ht 72.0 in | Wt 144.6 lb

## 2014-10-14 DIAGNOSIS — G43009 Migraine without aura, not intractable, without status migrainosus: Secondary | ICD-10-CM

## 2014-10-14 MED ORDER — ACETAMINOPHEN-CODEINE #3 300-30 MG PO TABS: 1.0000 | ORAL_TABLET | Freq: Four times a day (QID) | ORAL | Status: DC | PRN

## 2014-10-14 MED ORDER — DIAZEPAM 10 MG PO TABS: 5.0000 mg | ORAL_TABLET | Freq: Two times a day (BID) | ORAL | Status: DC

## 2014-10-14 NOTE — Progress Notes (Signed)
Reason for visit: Migraine headache  Jesus Alvarez is an 74 y.o. male  History of present illness:  Jesus Alvarez is a 74 year old right-handed white male with a history of migraine headaches. He has done very well with his headaches, having 1 every month or 2. The headaches have become less frequent and less severe over time. He has Tylenol 3 to take for the headache. He is mainly bothered by intermittent left lower quadrant abdominal pain that will project into the rectum. This is associated with bowel contractions, Bentyl will help the pain and heat to the abdomen will help the pain. He has been to a multitude of gastroenterologists without any definitive diagnosis with the cause of his bowel pain. The patient has bowel pain daily. He also reports neck pain associated with spondylosis. He is restricted on how long he can use the computer because of this. He returns this office today for an evaluation. He is on Topamax for the migraine with good tolerance.  Past Medical History  Diagnosis Date  . Arthritis     DEGENERATIVE  . Chronic pain syndrome   . Osteoporosis   . IBS (irritable bowel syndrome)   . Migraine without aura, without mention of intractable migraine without mention of status migrainosus 10/12/2013    Past Surgical History  Procedure Laterality Date  . Spine surgery    . Elbow fracture surgery Right   . Sinus exploration      X3  . Hemorrhoid surgery    . Lumbosacral surgery      Family History  Problem Relation Age of Onset  . Arthritis Sister   . Parkinsonism Brother   . Arthritis Sister     Social history:  reports that he has quit smoking. He has never used smokeless tobacco. He reports that he does not drink alcohol or use illicit drugs.    Allergies  Allergen Reactions  . Nsaids     Medications:  Current Outpatient Prescriptions on File Prior to Visit  Medication Sig Dispense Refill  . acyclovir (ZOVIRAX) 400 MG tablet Take 1 tablet (400 mg  total) by mouth 3 (three) times daily. (Patient taking differently: Take 400 mg by mouth as needed. ) 15 tablet 3  . bisoprolol (ZEBETA) 10 MG tablet Take 10 mg by mouth daily.    . Calcium Carbonate-Vitamin D (CALTRATE 600+D) 600-400 MG-UNIT per tablet Take 1 tablet by mouth daily.    Marland Kitchen dicyclomine (BENTYL) 20 MG tablet Take 20 mg by mouth daily.    Marland Kitchen FLUZONE HIGH-DOSE injection Inject 0.5 mLs into the muscle once as needed.    . lidocaine (XYLOCAINE) 2 % solution Use as directed 1 mL in the mouth or throat every 6 (six) hours as needed for mouth pain. 100 mL 3  . lidocaine-hydrocortisone (ANAMANTEL HC) 3-0.5 % CREA Place 0.5 g rectally as needed.    Marland Kitchen NEXIUM 40 MG capsule Take 40 mg by mouth daily.    Marland Kitchen oxyCODONE-acetaminophen (PERCOCET) 10-325 MG per tablet Take 10-325 tablets by mouth 3 (three) times daily.    . promethazine (PHENERGAN) 25 MG tablet Take 25 mg by mouth daily as needed.    . tamsulosin (FLOMAX) 0.4 MG CAPS capsule Take 0.4 mg by mouth daily.    Marland Kitchen topiramate (TOPAMAX) 50 MG tablet TAKE 1 TABLET AT NIGHT 90 tablet 3  . traMADol (ULTRAM) 50 MG tablet Take 50 mg by mouth every 6 (six) hours as needed.    . triamcinolone (KENALOG) 0.1 %  paste Use as directed 0.1 g in the mouth or throat as needed.     No current facility-administered medications on file prior to visit.    ROS:  Out of a complete 14 system review of symptoms, the patient complains only of the following symptoms, and all other reviewed systems are negative.  Environmental allergies Joint pain, back pain, muscle cramps, neck stiffness Headache  Blood pressure 117/74, pulse 64, height 6' (1.829 m), weight 144 lb 9.6 oz (65.59 kg).  Physical Exam  General: The patient is alert and cooperative at the time of the examination.  Skin: No significant peripheral edema is noted.   Neurologic Exam  Mental status: The patient is oriented x 3.  Cranial nerves: Facial symmetry is present. Speech is normal, no  aphasia or dysarthria is noted. Extraocular movements are full. Visual fields are full.  Motor: The patient has good strength in all 4 extremities.  Sensory examination: Soft touch sensation is symmetric on the face, arms, and legs.  Coordination: The patient has good finger-nose-finger and heel-to-shin bilaterally.  Gait and station: The patient has a normal gait. Tandem gait is normal. Romberg is negative. No drift is seen.  Reflexes: Deep tendon reflexes are symmetric.   Assessment/Plan:  1. Migraine headache  2. Chronic abdominal discomfort  The patient will continue on the Topamax, diazepam, and Tylenol No. 3 if required. The patient was given a prescription for the Tylenol No. 3. He will follow-up in one year.  Jesus Palau. Keith Willis MD 10/14/2014 10:00 PM  [facility] [address] [place], [place] [id number]  Phone [phone] Fax [phone]

## 2014-10-14 NOTE — Patient Instructions (Signed)

## 2014-10-27 ENCOUNTER — Other Ambulatory Visit: Payer: Self-pay | Admitting: Neurology

## 2014-10-27 MED ORDER — TOPIRAMATE 50 MG PO TABS: ORAL_TABLET | ORAL | Status: DC

## 2014-10-27 MED ORDER — DIAZEPAM 10 MG PO TABS: 5.0000 mg | ORAL_TABLET | Freq: Two times a day (BID) | ORAL | Status: DC

## 2014-10-27 NOTE — Telephone Encounter (Signed)
Rx signed and faxed.

## 2014-10-27 NOTE — Telephone Encounter (Signed)
Patient stated he need's Rx refill's for topiramate (TOPAMAX) 50 MG tablet and [facility] [address] [facility] along with Rx diazepam (VALIUM) 10 MG tablet.  Please call and advise.

## 2014-10-27 NOTE — Telephone Encounter (Signed)
Patient is requesting new Rx's for 90 day supply.  Request forwarded to provider for approval.

## 2015-04-25 ENCOUNTER — Other Ambulatory Visit: Payer: Self-pay | Admitting: Neurology

## 2015-04-25 NOTE — Telephone Encounter (Signed)
Rx signed and faxed.

## 2015-10-17 ENCOUNTER — Ambulatory Visit (INDEPENDENT_AMBULATORY_CARE_PROVIDER_SITE_OTHER): Payer: Medicare Other | Admitting: Neurology

## 2015-10-17 ENCOUNTER — Encounter: Payer: Self-pay | Admitting: Neurology

## 2015-10-17 VITALS — BP 119/77 | HR 64 | Ht 72.0 in | Wt 138.5 lb

## 2015-10-17 DIAGNOSIS — G43009 Migraine without aura, not intractable, without status migrainosus: Secondary | ICD-10-CM

## 2015-10-17 MED ORDER — ACETAMINOPHEN-CODEINE #3 300-30 MG PO TABS: 1.0000 | ORAL_TABLET | Freq: Four times a day (QID) | ORAL | Status: DC | PRN

## 2015-10-17 MED ORDER — DIAZEPAM 10 MG PO TABS: ORAL_TABLET | ORAL | Status: DC

## 2015-10-17 MED ORDER — TOPIRAMATE 50 MG PO TABS: ORAL_TABLET | ORAL | Status: DC

## 2015-10-17 NOTE — Patient Instructions (Signed)
Migraine Headache A migraine headache is an intense, throbbing pain on one or both sides of your head. A migraine can last for 30 minutes to several hours. CAUSES  The exact cause of a migraine headache is not always known. However, a migraine may be caused when nerves in the brain become irritated and release chemicals that cause inflammation. This causes pain. Certain things may also trigger migraines, such as:  Alcohol.  Smoking.  Stress.  Menstruation.  Aged cheeses.  Foods or drinks that contain nitrates, glutamate, aspartame, or tyramine.  Lack of sleep.  Chocolate.  Caffeine.  Hunger.  Physical exertion.  Fatigue.  Medicines used to treat chest pain (nitroglycerine), birth control pills, estrogen, and some blood pressure medicines. SIGNS AND SYMPTOMS  Pain on one or both sides of your head.  Pulsating or throbbing pain.  Severe pain that prevents daily activities.  Pain that is aggravated by any physical activity.  Nausea, vomiting, or both.  Dizziness.  Pain with exposure to bright lights, loud noises, or activity.  General sensitivity to bright lights, loud noises, or smells. Before you get a migraine, you may get warning signs that a migraine is coming (aura). An aura may include:  Seeing flashing lights.  Seeing bright spots, halos, or zigzag lines.  Having tunnel vision or blurred vision.  Having feelings of numbness or tingling.  Having trouble talking.  Having muscle weakness. DIAGNOSIS  A migraine headache is often diagnosed based on:  Symptoms.  Physical exam.  A CT scan or MRI of your head. These imaging tests cannot diagnose migraines, but they can help rule out other causes of headaches. TREATMENT Medicines may be given for pain and nausea. Medicines can also be given to help prevent recurrent migraines.  HOME CARE INSTRUCTIONS  Only take over-the-counter or prescription medicines for pain or discomfort as directed by your  health care provider. The use of long-term narcotics is not recommended.  Lie down in a dark, quiet room when you have a migraine.  Keep a journal to find out what may trigger your migraine headaches. For example, write down:  What you eat and drink.  How much sleep you get.  Any change to your diet or medicines.  Limit alcohol consumption.  Quit smoking if you smoke.  Get 7-9 hours of sleep, or as recommended by your health care provider.  Limit stress.  Keep lights dim if bright lights bother you and make your migraines worse. SEEK IMMEDIATE MEDICAL CARE IF:   Your migraine becomes severe.  You have a fever.  You have a stiff neck.  You have vision loss.  You have muscular weakness or loss of muscle control.  You start losing your balance or have trouble walking.  You feel faint or pass out.  You have severe symptoms that are different from your first symptoms. MAKE SURE YOU:   Understand these instructions.  Will watch your condition.  Will get help right away if you are not doing well or get worse.   This information is not intended to replace advice given to you by your health care provider. Make sure you discuss any questions you have with your health care provider.   Document Released: 08/20/2005 Document Revised: 09/10/2014 Document Reviewed: 04/27/2013 Elsevier Interactive Patient Education 2016 [employer]. Migraine Headache A migraine headache is an intense, throbbing pain on one or both sides of your head. A migraine can last for 30 minutes to several hours. CAUSES  The exact cause of a  a migraine headache is not always known. However, a migraine may be caused when nerves in the brain become irritated and release chemicals that cause inflammation. This causes pain. °Certain things may also trigger migraines, such as: °· Alcohol. °· Smoking. °· Stress. °· Menstruation. °· Aged cheeses. °· Foods or drinks that contain nitrates, glutamate, aspartame, or  tyramine. °· Lack of sleep. °· Chocolate. °· Caffeine. °· Hunger. °· Physical exertion. °· Fatigue. °· Medicines used to treat chest pain (nitroglycerine), birth control pills, estrogen, and some blood pressure medicines. °SIGNS AND SYMPTOMS °· Pain on one or both sides of your head. °· Pulsating or throbbing pain. °· Severe pain that prevents daily activities. °· Pain that is aggravated by any physical activity. °· Nausea, vomiting, or both. °· Dizziness. °· Pain with exposure to bright lights, loud noises, or activity. °· General sensitivity to bright lights, loud noises, or smells. °Before you get a migraine, you may get warning signs that a migraine is coming (aura). An aura may include: °· Seeing flashing lights. °· Seeing bright spots, halos, or zigzag lines. °· Having tunnel vision or blurred vision. °· Having feelings of numbness or tingling. °· Having trouble talking. °· Having muscle weakness. °DIAGNOSIS  °A migraine headache is often diagnosed based on: °· Symptoms. °· Physical exam. °· A CT scan or MRI of your head. These imaging tests cannot diagnose migraines, but they can help rule out other causes of headaches. °TREATMENT °Medicines may be given for pain and nausea. Medicines can also be given to help prevent recurrent migraines.  °HOME CARE INSTRUCTIONS °· Only take over-the-counter or prescription medicines for pain or discomfort as directed by your health care provider. The use of long-term narcotics is not recommended. °· Lie down in a dark, quiet room when you have a migraine. °· Keep a journal to find out what may trigger your migraine headaches. For example, write down: °¨ What you eat and drink. °¨ How much sleep you get. °¨ Any change to your diet or medicines. °· Limit alcohol consumption. °· Quit smoking if you smoke. °· Get 7-9 hours of sleep, or as recommended by your health care provider. °· Limit stress. °· Keep lights dim if bright lights bother you and make your migraines  worse. °SEEK IMMEDIATE MEDICAL CARE IF:  °· Your migraine becomes severe. °· You have a fever. °· You have a stiff neck. °· You have vision loss. °· You have muscular weakness or loss of muscle control. °· You start losing your balance or have trouble walking. °· You feel faint or pass out. °· You have severe symptoms that are different from your first symptoms. °MAKE SURE YOU:  °· Understand these instructions. °· Will watch your condition. °· Will get help right away if you are not doing well or get worse. °  °This information is not intended to replace advice given to you by your health care provider. Make sure you discuss any questions you have with your health care provider. °  °Document Released: 08/20/2005 Document Revised: 09/10/2014 Document Reviewed: 04/27/2013 °Elsevier Interactive Patient Education ©2016 Elsevier Inc. ° °

## 2015-10-17 NOTE — Progress Notes (Signed)
Reason for visit: Headache  Jesus Alvarez is an 75 y.o. male  History of present illness:  Jesus Alvarez is a 75 year old right-handed white male with a history of migraine headaches. As he has gotten older, the headaches become less frequent. The patient may go several months without any headaches, then he may have a flurry of several headaches in one week. The patient last had a headache yesterday. He has ongoing issues with low back pain, left hip pain, and rectal pain. The patient has not gotten full control of this discomfort. The patient is having some dental issues as well, he is not able to eat well and he has lost the sense of taste because of sinus disease. He takes Boost as nutritional supplementation. The patient was to have a spinal stimulator, this was never approved through his insurance, however. The patient comes to this office for an evaluation.  Past Medical History  Diagnosis Date  . Arthritis     DEGENERATIVE  . Chronic pain syndrome   . Osteoporosis   . IBS (irritable bowel syndrome)   . Migraine without aura, without mention of intractable migraine without mention of status migrainosus 10/12/2013    Past Surgical History  Procedure Laterality Date  . Spine surgery    . Elbow fracture surgery Right   . Sinus exploration      X3  . Hemorrhoid surgery    . Lumbosacral surgery      Family History  Problem Relation Age of Onset  . Arthritis Sister   . Parkinsonism Brother   . Arthritis Sister     Social history:  reports that he has quit smoking. He has never used smokeless tobacco. He reports that he does not drink alcohol or use illicit drugs.    Allergies  Allergen Reactions  . Nabumetone Other (See Comments)    Unknown  . Naproxen Sodium Other (See Comments)    ulcers  . Oxaprozin Other (See Comments)    Unknown  . Rofecoxib Other (See Comments)    Unknown  . Aspirin Rash  . Azithromycin Rash  . Erythromycin Base Rash  . Nsaids Rash  .  Prednisone Rash    Medications:  Prior to Admission medications   Medication Sig Start Date End Date Taking? Authorizing Provider  acetaminophen-codeine (TYLENOL #3) 300-30 MG tablet Take 1 tablet by mouth every 6 (six) hours as needed. 10/17/15  Yes York Spaniel, MD  acyclovir (ZOVIRAX) 400 MG tablet Take 1 tablet (400 mg total) by mouth 3 (three) times daily. Patient taking differently: Take 400 mg by mouth as needed.  08/25/13  Yes Elvina Sidle, MD  bisoprolol (ZEBETA) 10 MG tablet Take 10 mg by mouth daily. 09/05/13  Yes Historical Provider, MD  Calcium Carbonate-Vitamin D (CALTRATE 600+D) 600-400 MG-UNIT per tablet Take 1 tablet by mouth daily.   Yes Historical Provider, MD  diazepam (VALIUM) 10 MG tablet TAKE 0.5 TABLETS BY MOUTH 2 TIMES DAILY 10/17/15  Yes York Spaniel, MD  dicyclomine (BENTYL) 20 MG tablet Take 20 mg by mouth 2 (two) times daily.  09/15/13  Yes Historical Provider, MD  FLUZONE HIGH-DOSE injection Inject 0.5 mLs into the muscle once as needed. 07/21/13  Yes Historical Provider, MD  lidocaine (XYLOCAINE) 2 % solution Use as directed 1 mL in the mouth or throat every 6 (six) hours as needed for mouth pain. 10/12/13  Yes York Spaniel, MD  NEXIUM 40 MG capsule Take 40 mg by mouth daily. 09/04/13  Yes Historical Provider, MD  oxyCODONE-acetaminophen (PERCOCET) 10-325 MG per tablet Take 10-325 tablets by mouth 3 (three) times daily. 09/18/13  Yes Historical Provider, MD  promethazine (PHENERGAN) 25 MG tablet Take 25 mg by mouth daily as needed. 08/18/13  Yes Historical Provider, MD  tamsulosin (FLOMAX) 0.4 MG CAPS capsule Take 0.4 mg by mouth daily.   Yes Historical Provider, MD  topiramate (TOPAMAX) 50 MG tablet TAKE 1 TABLET AT NIGHT 10/17/15  Yes York Spaniel, MD  traMADol (ULTRAM) 50 MG tablet Take 50 mg by mouth every 6 (six) hours as needed.   Yes Historical Provider, MD  triamcinolone (KENALOG) 0.1 % paste Use as directed 0.1 g in the mouth or throat as needed.  09/15/13  Yes Historical Provider, MD    ROS:  Out of a complete 14 system review of symptoms, the patient complains only of the following symptoms, and all other reviewed systems are negative.  Headache Low back pain  Blood pressure 119/77, pulse 64, height 6' (1.829 m), weight 138 lb 8 oz (62.823 kg).  Physical Exam  General: The patient is alert and cooperative at the time of the examination.  Skin: No significant peripheral edema is noted.   Neurologic Exam  Mental status: The patient is alert and oriented x 3 at the time of the examination. The patient has apparent normal recent and remote memory, with an apparently normal attention span and concentration ability.   Cranial nerves: Facial symmetry is present. Speech is normal, no aphasia or dysarthria is noted. Extraocular movements are full. Visual fields are full.  Motor: The patient has good strength in all 4 extremities.  Sensory examination: Soft touch sensation is symmetric on the face, arms, and legs.  Coordination: The patient has good finger-nose-finger and heel-to-shin bilaterally.  Gait and station: The patient has a normal gait. Tandem gait is normal. Romberg is negative. No drift is seen.  Reflexes: Deep tendon reflexes are symmetric.   Assessment/Plan:  1. Migraine headache  2. Chronic low back pain  3. Chronic rectal and abdominal pain  The patient will continue his Topamax for the migraine, he is having fewer headaches as time has gone on. A prescription was given for this and for Tylenol #3 and for diazepam. He will follow-up in one year, sooner if needed.  [name] MD 10/17/2015 8:26 PM  [facility] [address] [place], [place] [address]  Phone [phone] Fax [phone]

## 2015-10-21 ENCOUNTER — Other Ambulatory Visit: Payer: Self-pay | Admitting: Neurology

## 2015-12-27 ENCOUNTER — Other Ambulatory Visit: Payer: Self-pay | Admitting: Neurology

## 2016-04-23 ENCOUNTER — Other Ambulatory Visit: Payer: Self-pay | Admitting: Neurology

## 2016-04-24 NOTE — Telephone Encounter (Signed)
Pt called said the RX expired 04/14/16. He sts he did not need a refill at that time. He sts the last time it was filled is 01/19/16 (95 days). Please call at [phone]

## 2016-04-26 MED ORDER — DIAZEPAM 10 MG PO TABS: ORAL_TABLET | ORAL | 1 refills | Status: DC

## 2016-04-26 NOTE — Telephone Encounter (Signed)
Last OV 10/17/15 w/ refills at that time 1 yr follow-up scheduled 10/16/16

## 2016-04-26 NOTE — Addendum Note (Signed)
Addended by: Donnelly AngelicaHOGAN, JENNIFER L on: 04/26/2016 01:59 PM   Modules accepted: Orders

## 2016-04-26 NOTE — Telephone Encounter (Signed)
Pt called in today asking about new rx   diazepam (VALIUM) 10 MG tablet - needs to be for 90 day supply. Previous rx expired. Pt is out of medication Please call and advise [phone]

## 2016-04-26 NOTE — Telephone Encounter (Signed)
Rx printed, signed, faxed to pharmacy. Pt notified via TC. 

## 2016-10-16 ENCOUNTER — Encounter: Payer: Self-pay | Admitting: Neurology

## 2016-10-16 ENCOUNTER — Telehealth: Payer: Self-pay | Admitting: Neurology

## 2016-10-16 ENCOUNTER — Ambulatory Visit (INDEPENDENT_AMBULATORY_CARE_PROVIDER_SITE_OTHER): Payer: Medicare Other | Admitting: Neurology

## 2016-10-16 VITALS — BP 110/70 | HR 64 | Ht 72.0 in | Wt 143.0 lb

## 2016-10-16 DIAGNOSIS — G43009 Migraine without aura, not intractable, without status migrainosus: Secondary | ICD-10-CM | POA: Diagnosis not present

## 2016-10-16 MED ORDER — TOPIRAMATE 25 MG PO TABS: 75.0000 mg | ORAL_TABLET | Freq: Every day | ORAL | 3 refills | Status: DC

## 2016-10-16 MED ORDER — DIAZEPAM 10 MG PO TABS: ORAL_TABLET | ORAL | 1 refills | Status: DC

## 2016-10-16 NOTE — Telephone Encounter (Signed)
This patient did not show for a revisit appointment today. 

## 2016-10-16 NOTE — Progress Notes (Signed)
Reason for visit: Migraine headache  Jesus KingfisherJames Kabel is an 76 y.o. male  History of present illness:  Jesus Alvarez is a 76 year old right-handed white male with a history of intractable migraine headaches. The patient has about 15 significant headaches a year. The headaches usually last about 24 hours. The biggest activators for the headache include weather changes and bright lights. The patient takes Topamax, he may take Tylenol #3 for the headache when they do occur. This seems to be helpful. The patient has visual aura lasting 20-30 minutes prior to the onset of the headache. He continues to have abdominal and back pain and rectal discomfort. The etiology of this discomfort has never been determined. The patient recently has developed hoarseness that he claims was related to tablets getting stuck in his throat. He denies any difficulty with swallowing, but he did have a sore throat when the issue first began.  Past Medical History:  Diagnosis Date  . Arthritis    DEGENERATIVE  . Chronic pain syndrome   . IBS (irritable bowel syndrome)   . Migraine without aura, without mention of intractable migraine without mention of status migrainosus 10/12/2013  . Osteoporosis     Past Surgical History:  Procedure Laterality Date  . ELBOW FRACTURE SURGERY Right   . HEMORRHOID SURGERY    . LUMBOSACRAL SURGERY    . SINUS EXPLORATION     X3  . SPINE SURGERY      Family History  Problem Relation Age of Onset  . Arthritis Sister   . Parkinsonism Brother   . Arthritis Sister     Social history:  reports that he has quit smoking. He has never used smokeless tobacco. He reports that he does not drink alcohol or use drugs.    Allergies  Allergen Reactions  . Nabumetone Other (See Comments)    Unknown  . Naproxen Sodium Other (See Comments)    ulcers  . Oxaprozin Other (See Comments)    Unknown  . Rofecoxib Other (See Comments)    Unknown  . Aspirin Rash  . Azithromycin Rash  .  Erythromycin Base Rash  . Nsaids Rash  . Prednisone Rash    Medications:  Prior to Admission medications   Medication Sig Start Date End Date Taking? Authorizing Provider  acetaminophen-codeine (TYLENOL #3) 300-30 MG tablet Take 1 tablet by mouth every 6 (six) hours as needed. 10/17/15  Yes York Spanielharles K Willis, MD  acyclovir (ZOVIRAX) 400 MG tablet Take 1 tablet (400 mg total) by mouth 3 (three) times daily. Patient taking differently: Take 400 mg by mouth 3 (three) times daily. For fever blister 08/25/13  Yes Elvina SidleKurt Lauenstein, MD  bisoprolol (ZEBETA) 10 MG tablet Take 10 mg by mouth daily. 09/05/13  Yes Historical Provider, MD  Calcium Carbonate-Vitamin D (CALTRATE 600+D) 600-400 MG-UNIT per tablet Take 1 tablet by mouth daily.   Yes Historical Provider, MD  diazepam (VALIUM) 10 MG tablet TAKE 0.5 TABLETS BY MOUTH 2 TIMES DAILY 10/16/16  Yes York Spanielharles K Willis, MD  dicyclomine (BENTYL) 20 MG tablet Take 20 mg by mouth 2 (two) times daily.  09/15/13  Yes Historical Provider, MD  lidocaine (XYLOCAINE) 2 % solution Use as directed 1 mL in the mouth or throat every 6 (six) hours as needed for mouth pain. 10/12/13  Yes York Spanielharles K Willis, MD  lidocaine (XYLOCAINE) 2 % solution USE 1ML AS DIRECTED IN THE MOUTH OR THROAT EVERY 6 HOURS AS NEEDED FOR MOUTH PAIN. 12/29/15  Yes York Spanielharles K Willis,  MD  NEXIUM 40 MG capsule Take 40 mg by mouth daily. 09/04/13  Yes Historical Provider, MD  oxyCODONE-acetaminophen (PERCOCET) 10-325 MG per tablet Take 10-325 tablets by mouth 3 (three) times daily. 09/18/13  Yes Historical Provider, MD  promethazine (PHENERGAN) 25 MG tablet Take 25 mg by mouth daily as needed. 08/18/13  Yes Historical Provider, MD  tamsulosin (FLOMAX) 0.4 MG CAPS capsule Take 0.4 mg by mouth daily.   Yes Historical Provider, MD  traMADol (ULTRAM) 50 MG tablet Take 50 mg by mouth every 6 (six) hours as needed.   Yes Historical Provider, MD  triamcinolone (KENALOG) 0.1 % paste Use as directed 0.1 g in the mouth or  throat as needed. 09/15/13  Yes Historical Provider, MD  FLUZONE HIGH-DOSE injection Inject 0.5 mLs into the muscle once as needed. 07/21/13   Historical Provider, MD  topiramate (TOPAMAX) 25 MG tablet Take 3 tablets (75 mg total) by mouth at bedtime. 10/16/16   York Spaniel, MD    ROS:  Out of a complete 14 system review of symptoms, the patient complains only of the following symptoms, and all other reviewed systems are negative.  Runny nose Cold intolerance Snoring Joint pain, back pain, neck stiffness, neck pain Headache  Blood pressure 110/70, pulse 64, height 6' (1.829 m), weight 143 lb (64.9 kg).  Physical Exam  General: The patient is alert and cooperative at the time of the examination.  Skin: No significant peripheral edema is noted.   Neurologic Exam  Mental status: The patient is alert and oriented x 3 at the time of the examination. The patient has apparent normal recent and remote memory, with an apparently normal attention span and concentration ability.   Cranial nerves: Facial symmetry is present. Speech is hoarse, not aphasic. Extraocular movements are full. Visual fields are full.  Motor: The patient has good strength in all 4 extremities.  Sensory examination: Soft touch sensation is symmetric on the face, arms, and legs.  Coordination: The patient has good finger-nose-finger and heel-to-shin bilaterally.  Gait and station: The patient has a normal gait. Tandem gait is normal. Romberg is negative. No drift is seen.  Reflexes: Deep tendon reflexes are symmetric.   Assessment/Plan:  1. Migraine headache  The patient will go up on the Topamax taking 75 mg at night. He was given a prescription for diazepam as well. He will follow-up in one year, sooner if needed. I have recommended an ENT evaluation if the voice issue does not improve over the next week.  [name] MD 10/16/2016 3:46 PM  [facility] [address] [place], [place] [address]  Phone [phone] Fax [phone]

## 2016-10-16 NOTE — Patient Instructions (Signed)
   We will go up on the Topamax to 75 mg at night. 

## 2017-04-16 ENCOUNTER — Other Ambulatory Visit: Payer: Self-pay | Admitting: Neurology

## 2017-05-09 ENCOUNTER — Other Ambulatory Visit: Payer: Self-pay | Admitting: Neurology

## 2017-05-09 NOTE — Telephone Encounter (Signed)
[place] [facility] [facility] [place] [place] [phone]. Received confirmation.

## 2017-10-16 ENCOUNTER — Ambulatory Visit: Payer: Medicare Other | Admitting: Neurology

## 2017-10-28 ENCOUNTER — Other Ambulatory Visit: Payer: Self-pay | Admitting: Neurology

## 2017-11-11 ENCOUNTER — Other Ambulatory Visit: Payer: Self-pay | Admitting: Neurology

## 2017-12-13 ENCOUNTER — Other Ambulatory Visit: Payer: Self-pay | Admitting: Neurology

## 2018-02-17 ENCOUNTER — Emergency Department (HOSPITAL_BASED_OUTPATIENT_CLINIC_OR_DEPARTMENT_OTHER)
Admission: EM | Admit: 2018-02-17 | Discharge: 2018-02-18 | Disposition: A | Discharge status: Home / Self Care | Payer: Medicare Other | Attending: Emergency Medicine | Admitting: Emergency Medicine

## 2018-02-17 ENCOUNTER — Encounter (HOSPITAL_BASED_OUTPATIENT_CLINIC_OR_DEPARTMENT_OTHER): Payer: Self-pay | Admitting: *Deleted

## 2018-02-17 ENCOUNTER — Other Ambulatory Visit: Payer: Self-pay

## 2018-02-17 DIAGNOSIS — B001 Herpesviral vesicular dermatitis: Secondary | ICD-10-CM | POA: Insufficient documentation

## 2018-02-17 DIAGNOSIS — B370 Candidal stomatitis: Secondary | ICD-10-CM | POA: Diagnosis not present

## 2018-02-17 DIAGNOSIS — Z87891 Personal history of nicotine dependence: Secondary | ICD-10-CM | POA: Diagnosis not present

## 2018-02-17 DIAGNOSIS — Z79899 Other long term (current) drug therapy: Secondary | ICD-10-CM | POA: Diagnosis not present

## 2018-02-17 DIAGNOSIS — K1379 Other lesions of oral mucosa: Secondary | ICD-10-CM | POA: Diagnosis present

## 2018-02-17 NOTE — ED Notes (Addendum)
Alert, NAD, calm, interactive, resps e/u, speaking in clear complete sentences, no dyspnea noted, skin W&D, initial VSS, c/o thrush, tongue raw, sensitive and burning, also mentions fever blister, sinus/URI sx, PND, mild frontal sinus HA, (denies: other pain, sob, cough, fever, NVD, ear sx, dizziness or visual changes). Sitting comfortably in chair. LS CTA. Abd soft NT, BS present.

## 2018-02-17 NOTE — ED Notes (Signed)
Wait, plan and process explained. Moved to warmer area per request, from chair to stretcher. Warm blankets given. Pt verbalizes understanding and is agreeable.

## 2018-02-17 NOTE — ED Triage Notes (Signed)
Pain in his mouth and nose since yesterday. States it is painful to eat. He has a fever blister on his lower lip. He has blister in his cheeks and on his tongue. Redness and soreness in his nostrils.

## 2018-02-18 MED ORDER — NYSTATIN 100000 UNIT/ML MT SUSP: 500000.0000 [IU] | Freq: Four times a day (QID) | OROMUCOSAL | 0 refills | Status: DC

## 2018-02-18 MED ORDER — VALACYCLOVIR HCL 1 G PO TABS: 1000.0000 mg | ORAL_TABLET | Freq: Three times a day (TID) | ORAL | 0 refills | Status: AC

## 2018-02-18 NOTE — ED Provider Notes (Signed)
[facility] EMERGENCY DEPARTMENT Provider Note   CSN: [id number] Arrival date & time: 02/17/18  2049     History   Chief Complaint No chief complaint on file.   HPI Jesus Alvarez is a 77 y.o. male.  Patient is a 77 year old male with history of irritable bowel, arthritis.  He presents today for evaluation of mouth pain.  He reports a white coating to his tongue and throat that is painful and hurts to swallow.  He also reports a fever blister to his left lower lip.  He denies any fevers or chills.  He denies any difficulty breathing.  The history is provided by the patient.    Past Medical History:  Diagnosis Date  . Arthritis    DEGENERATIVE  . Chronic pain syndrome   . IBS (irritable bowel syndrome)   . Migraine without aura, without mention of intractable migraine without mention of status migrainosus 10/12/2013  . Osteoporosis     Patient Active Problem List   Diagnosis Date Noted  . Migraine without aura 10/12/2013    Past Surgical History:  Procedure Laterality Date  . ELBOW FRACTURE SURGERY Right   . HEMORRHOID SURGERY    . LUMBOSACRAL SURGERY    . SINUS EXPLORATION     X3  . SPINE SURGERY          Home Medications    Prior to Admission medications   Medication Sig Start Date End Date Taking? Authorizing Provider  acetaminophen-codeine (TYLENOL #3) 300-30 MG tablet Take 1 tablet by mouth every 6 (six) hours as needed. 10/17/15   York Spaniel, MD  acyclovir (ZOVIRAX) 400 MG tablet Take 1 tablet (400 mg total) by mouth 3 (three) times daily. Patient taking differently: Take 400 mg by mouth 3 (three) times daily. For fever blister 08/25/13   Elvina Sidle, MD  bisoprolol (ZEBETA) 10 MG tablet Take 10 mg by mouth daily. 09/05/13   [provider]  Calcium Carbonate-Vitamin D (CALTRATE 600+D) 600-400 MG-UNIT per tablet Take 1 tablet by mouth daily.    [provider]  diazepam (VALIUM) 10 MG tablet TAKE 1/2 TABLET BY MOUTH  TWICE A DAY 11/12/17   York Spaniel, MD  dicyclomine (BENTYL) 20 MG tablet Take 20 mg by mouth 2 (two) times daily.  09/15/13   [provider]  FLUZONE HIGH-DOSE injection Inject 0.5 mLs into the muscle once as needed. 07/21/13   [provider]  lidocaine (XYLOCAINE) 2 % solution Use as directed 1 mL in the mouth or throat every 6 (six) hours as needed for mouth pain. 10/12/13   York Spaniel, MD  lidocaine (XYLOCAINE) 2 % solution USE AS DIRECTED IN THE MOUTH OR THROAT EVERY 6 HOURS AS NEEDED FOR MOUTH PAIN. 12/13/17   York Spaniel, MD  NEXIUM 40 MG capsule Take 40 mg by mouth daily. 09/04/13   [provider]  oxyCODONE-acetaminophen (PERCOCET) 10-325 MG per tablet Take 10-325 tablets by mouth 3 (three) times daily. 09/18/13   [provider]  promethazine (PHENERGAN) 25 MG tablet Take 25 mg by mouth daily as needed. 08/18/13   [provider]  tamsulosin (FLOMAX) 0.4 MG CAPS capsule Take 0.4 mg by mouth daily.    [provider]  topiramate (TOPAMAX) 25 MG tablet TAKE 3 TABLETS (75 MG TOTAL) BY MOUTH AT BEDTIME. 10/28/17   York Spaniel, MD  traMADol (ULTRAM) 50 MG tablet Take 50 mg by mouth every 6 (six) hours as needed.  [provider]  triamcinolone (KENALOG) 0.1 % paste Use as directed 0.1 g in the mouth or throat as needed. 09/15/13   [provider]    Family History Family History  Problem Relation Age of Onset  . Arthritis Sister   . Parkinsonism Brother   . Arthritis Sister     Social History Social History   Tobacco Use  . Smoking status: Former Games developermoker  . Smokeless tobacco: Never Used  . Tobacco comment: QUIT SMOKING 1974  Substance Use Topics  . Alcohol use: No  . Drug use: No     Allergies   Nabumetone; Naproxen sodium; Oxaprozin; Rofecoxib; Aspirin; Azithromycin; Erythromycin base; Nsaids; and Prednisone   Review of Systems Review of Systems  All other systems reviewed and  are negative.    Physical Exam Updated Vital Signs BP (!) 127/96 (BP Location: Left Arm)   Pulse 60   Temp 98.2 F (36.8 C) (Oral)   Resp 16   Ht 5\' 11"  (1.803 m)   Wt 60.8 kg (134 lb)   SpO2 100%   BMI 18.69 kg/m   Physical Exam  Constitutional: He is oriented to person, place, and time. He appears well-developed and well-nourished. No distress.  HENT:  Head: Normocephalic and atraumatic.  There is a white coating to the tongue and posterior oropharynx.  Also present is a small ulcer to the lower lip.  Neck: Normal range of motion. Neck supple.  Pulmonary/Chest: Effort normal.  Musculoskeletal: Normal range of motion. He exhibits no edema.  Neurological: He is alert and oriented to person, place, and time. Coordination normal.  Skin: Skin is warm and dry. He is not diaphoretic.  Nursing note and vitals reviewed.    ED Treatments / Results  Labs (all labs ordered are listed, but only abnormal results are displayed) Labs Reviewed - No data to display  EKG None  Radiology No results found.  Procedures Procedures (including critical care time)  Medications Ordered in ED Medications - No data to display   Initial Impression / Assessment and Plan / ED Course  I have reviewed the triage vital signs and the nursing notes.  Pertinent labs & imaging results that were available during my care of the patient were reviewed by me and considered in my medical decision making (see chart for details).  This appears to be thrush and a fever blister to the lower lip.  Will treat with valtrex, nystatin.  Final Clinical Impressions(s) / ED Diagnoses   Final diagnoses:  None    ED Discharge Orders    None       Geoffery Lyonselo, Linet Brash, MD 02/18/18 0025

## 2018-02-18 NOTE — Discharge Instructions (Addendum)
Valtrex and nystatin as prescribed.  Follow-up with your primary doctor if symptoms are not improving in the next week.

## 2018-04-23 ENCOUNTER — Ambulatory Visit (INDEPENDENT_AMBULATORY_CARE_PROVIDER_SITE_OTHER): Payer: Medicare Other | Admitting: Neurology

## 2018-04-23 ENCOUNTER — Encounter

## 2018-04-23 ENCOUNTER — Encounter: Payer: Self-pay | Admitting: Neurology

## 2018-04-23 VITALS — BP 100/58 | HR 58 | Ht 71.0 in | Wt 130.5 lb

## 2018-04-23 DIAGNOSIS — G43009 Migraine without aura, not intractable, without status migrainosus: Secondary | ICD-10-CM

## 2018-04-23 MED ORDER — DIAZEPAM 10 MG PO TABS: 5.0000 mg | ORAL_TABLET | Freq: Two times a day (BID) | ORAL | 1 refills | Status: DC

## 2018-04-23 MED ORDER — TOPIRAMATE 25 MG PO TABS: 75.0000 mg | ORAL_TABLET | Freq: Every day | ORAL | 1 refills | Status: DC

## 2018-04-23 NOTE — Patient Instructions (Signed)
Try to taper off of the Topamax by 25 mg every 6 weeks until off of the medication.  If the headaches return, restart the medication.

## 2018-04-23 NOTE — Progress Notes (Signed)
Reason for visit:  Migraine headache  Jesus Alvarez is an 77 y.o. male  History of present illness:  Jesus Alvarez is a 77 year old right handed white male with a history of migraine headache. He has done quite well with this since last seen, he has not had any headache since July of 2018.  The patient remains on Topamax taking 75 mg daily, he takes diazepam if needed.  Overall he has done fairly well since last seen, he continues to have low back pain, he will be going in for bilateral hernia surgery in the near future.  The patient returns for an evaluation.  Past Medical History:  Diagnosis Date  . Arthritis    DEGENERATIVE  . Chronic pain syndrome   . IBS (irritable bowel syndrome)   . Migraine without aura, without mention of intractable migraine without mention of status migrainosus 10/12/2013  . Osteoporosis     Past Surgical History:  Procedure Laterality Date  . ELBOW FRACTURE SURGERY Right   . HEMORRHOID SURGERY    . LUMBOSACRAL SURGERY    . SINUS EXPLORATION     X3  . SPINE SURGERY      Family History  Problem Relation Age of Onset  . Arthritis Sister   . Parkinsonism Brother   . Arthritis Sister     Social history:  reports that he has quit smoking. He has never used smokeless tobacco. He reports that he does not drink alcohol or use drugs.    Allergies  Allergen Reactions  . Nabumetone Other (See Comments)    Unknown  . Naproxen Sodium Other (See Comments)    ulcers  . Oxaprozin Other (See Comments)    Unknown  . Rofecoxib Other (See Comments)    Unknown  . Aspirin Rash  . Azithromycin Rash  . Erythromycin Base Rash  . Nsaids Rash  . Prednisone Rash    Medications:  Prior to Admission medications   Medication Sig Start Date End Date Taking? Authorizing Provider  acetaminophen-codeine (TYLENOL #3) 300-30 MG tablet Take 1 tablet by mouth every 6 (six) hours as needed. 10/17/15  Yes York SpanielWillis, Charles K, MD  bisoprolol (ZEBETA) 10 MG tablet Take  10 mg by mouth daily. 09/05/13  Yes [provider]  Calcium Carbonate-Vitamin D (CALTRATE 600+D) 600-400 MG-UNIT per tablet Take 1 tablet by mouth daily.   Yes [provider]  diazepam (VALIUM) 10 MG tablet Take 0.5 tablets (5 mg total) by mouth 2 (two) times daily. 04/23/18  Yes York SpanielWillis, Charles K, MD  dicyclomine (BENTYL) 20 MG tablet Take 20 mg by mouth 2 (two) times daily.  09/15/13  Yes [provider]  FLUZONE HIGH-DOSE injection Inject 0.5 mLs into the muscle once as needed. 07/21/13  Yes [provider]  lidocaine (XYLOCAINE) 2 % solution Use as directed 1 mL in the mouth or throat every 6 (six) hours as needed for mouth pain. 10/12/13  Yes York SpanielWillis, Charles K, MD  lidocaine (XYLOCAINE) 2 % solution USE 1ML AS DIRECTED IN THE MOUTH OR THROAT EVERY 6 HOURS AS NEEDED FOR MOUTH PAIN. 12/13/17  Yes York SpanielWillis, Charles K, MD  NEXIUM 40 MG capsule Take 40 mg by mouth daily. 09/04/13  Yes [provider]  oxyCODONE-acetaminophen (PERCOCET) 10-325 MG per tablet Take 10-325 tablets by mouth 3 (three) times daily. 09/18/13  Yes [provider]  promethazine (PHENERGAN) 25 MG tablet Take 25 mg by mouth daily as needed. 08/18/13  Yes [provider]  tamsulosin (FLOMAX) 0.4 MG CAPS capsule Take 0.4 mg by mouth daily.   Yes [provider]  topiramate (TOPAMAX) 25 MG tablet Take 3 tablets (75 mg total) by mouth at bedtime. 04/23/18  Yes York SpanielWillis, Charles K, MD  traMADol (ULTRAM) 50 MG tablet Take 50 mg by mouth every 6 (six) hours as needed.   Yes [provider]  triamcinolone (KENALOG) 0.1 % paste Use as directed 0.1 g in the mouth or throat as needed. 09/15/13  Yes [provider]  UNABLE TO FIND Take 1 Dose by mouth daily. Med Name: Hemp oil. Takes for appetite. 16.7 mg per day   Yes [provider]  valACYclovir (VALTREX) 1000 MG tablet Take 1 tablet (1,000 mg total) by mouth 3 (three) times daily. 02/18/18  Yes Delo,  Riley Lamouglas, MD    ROS:  Out of a complete 14 system review of symptoms, the patient complains only of the following symptoms, and all other reviewed systems are negative.  Headache Back pain  Blood pressure (!) 100/58, pulse (!) 58, height 5\' 11"  (1.803 m), weight 130 lb 8 oz (59.2 kg), SpO2 98 %.  Physical Exam  General: The patient is alert and cooperative at the time of the examination.  Skin: No significant peripheral edema is noted.   Neurologic Exam  Mental status: The patient is alert and oriented x 3 at the time of the examination. The patient has apparent normal recent and remote memory, with an apparently normal attention span and concentration ability.   Cranial nerves: Facial symmetry is present. Speech is normal, no aphasia or dysarthria is noted. Extraocular movements are full. Visual fields are full.  Motor: The patient has good strength in all 4 extremities.  Sensory examination: Soft touch sensation is symmetric on the face, arms, and legs.  Coordination: The patient has good finger-nose-finger and heel-to-shin bilaterally.  Gait and station: The patient has a normal gait. Tandem gait is normal. Romberg is negative. No drift is seen.  Reflexes: Deep tendon reflexes are symmetric.   Assessment/Plan:  1.  Migraine headache  2.  Chronic low back pain  The patient overall is doing well with his headaches, he will taper off of the Topamax taking 50 mg daily for 6 weeks, 25 mg daily for 6 weeks, then stop the medication.  If the headaches recur, he will need to get back on the Topamax.  A prescription for Topamax and diazepam was sent in.  He will follow-up otherwise in 1 year.   Greater than 50% of the visit was spent in counseling and coordination of care.  Face-to-face time with the patient was 25 minutes.   [name]. Keith Willis MD 04/23/2018 10:37 AM  [facility] [address] [place], [place] [id number]  Phone [phone]  Fax [phone]

## 2018-05-12 ENCOUNTER — Other Ambulatory Visit: Payer: Self-pay | Admitting: Neurology

## 2018-08-11 ENCOUNTER — Telehealth: Payer: Self-pay | Admitting: Neurology

## 2018-08-11 NOTE — Telephone Encounter (Signed)
Adela LankJacqueline with [facility] calling to discuss oxyCODONE-acetaminophen (PERCOCET) 10-325 MG per tablet, traMADol (ULTRAM) 50 MG tablet and diazepam (VALIUM) 10 MG tablet.

## 2018-08-11 NOTE — Telephone Encounter (Signed)
The patient has a prescription for diazepam 10 mg to take 1/2 tablet twice a day.  If the patient is not taking medication it can be discontinued, I called and left a message, he is to let us know if he is still taking the medication, if he still he is, we will continue to write the prescription.

## 2018-08-11 NOTE — Telephone Encounter (Signed)
Checked drug registry. Appears patient receiving Oxycodone and tramadol from Benedetto GoadFred Wilson, [place]. Last received oxycodone 07/18/18 #90 and tramadol 07/15/18 #240.  Dr. Anne HahnWillis prescribes diazepam. Last refilled 05/13/18 #90 per drug registry

## 2018-08-11 NOTE — Telephone Encounter (Signed)
I contacted Pharmacist, [facility]Jacqueline at [facility]. She states the patient has reported over the past year he has not had to take his diazepam for his migraines.  She wanted to verify if this medication could be discontinued due to the high dosages of tramadol and oxycodone prescribed by his PCP. Could you review and please advise on how you would like to proceed?

## 2018-08-12 NOTE — Telephone Encounter (Signed)
FYI Pt has called back to let Dr Anne HahnWillis know that he will continue on the medication, no call back has been requested

## 2018-08-12 NOTE — Telephone Encounter (Signed)
See message to be advised.  [facility] advised via voicemail we would still be prescribing his medication. MB RN.

## 2018-10-19 ENCOUNTER — Other Ambulatory Visit: Payer: Self-pay | Admitting: Neurology

## 2018-11-06 ENCOUNTER — Telehealth: Payer: Self-pay | Admitting: Neurology

## 2018-11-07 NOTE — Telephone Encounter (Signed)
Pt's diazepam RX has expired. [place] Drug Registry checked. Pt is getting oxycodone and tramadol from Dr. Benedetto Goad. Will send to Dr. Anne Hahn for review.

## 2018-11-11 MED ORDER — DIAZEPAM 10 MG PO TABS: 5.0000 mg | ORAL_TABLET | Freq: Two times a day (BID) | ORAL | 1 refills | Status: DC

## 2018-11-11 NOTE — Telephone Encounter (Addendum)
I contacted [facility] and spoke with pharmacist Adela Lank. She stated to me she is denying to refill the diazepam  due to her feeling uncomfortable and it being her professional duty/right to not refill if the dosage is questionable. She states the rx for diazepam, oxycodone and tramadol are at high dosages (PCP handles oxycodone/tramadol) and she will not refill the diazepam. She did state she had reached out to PCP Dr. Denyse Amass to confirm dosages with no response on the tramadol.   I advised the pharmacist Dr. Anne Hahn is recommending the diazepam be refilled due to the dx of headaches. Adela Lank stated to me this is conflicting information because the pt reports he takes the diazepam as needed for migraines and muscles spasms. She states the pt could go to another pharmacy to have this refilled but a new prescription would need to be sent, she advised she would not transfer this medication.   I contacted Dr. Denyse Amass and spoke with Eunice Blase who states she will send a message to Dr. Paulita Cradle assistant regarding this message.    I contacted the pt and explained I would send a message to Dr. 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  I have updated the preferred pharmacy. He wanted to let Dr. Anne Hahn he is taking 50 mg daily of the Topamax.

## 2018-11-11 NOTE — Telephone Encounter (Signed)
I called the patient.  I will send in the prescription for his diazepam to [facility].

## 2018-11-11 NOTE — Addendum Note (Signed)
Addended by: York Spaniel on: 11/11/2018 04:47 PM   Modules accepted: Orders

## 2018-11-11 NOTE — Telephone Encounter (Signed)
Patient called in and stated the pharmacy refuses to fill his script, and he needs to speak with someone that can get her to fill fit

## 2019-01-27 ENCOUNTER — Other Ambulatory Visit: Payer: Self-pay | Admitting: Neurology

## 2019-02-04 ENCOUNTER — Other Ambulatory Visit: Payer: Self-pay | Admitting: General Surgery

## 2019-03-17 ENCOUNTER — Other Ambulatory Visit (HOSPITAL_COMMUNITY): Payer: Self-pay | Admitting: *Deleted

## 2019-03-17 NOTE — Patient Instructions (Addendum)
YOU NEED TO HAVE A COVID 19 TEST ON 03-19-2019 AT 100 PM. THIS TEST MUST BE DONE BEFORE SURGERY, COME TO [place] ENTRANCE. ONCE YOUR COVID TEST IS COMPLETED, PLEASE BEGIN THE QUARANTINE INSTRUCTIONS AS OUTLINED IN YOUR HANDOUT.                Jesus Alvarez    Your procedure is scheduled on: 03-23-2019   Report to [facility] Main  Entrance                Report to [place] at 530  AM      Call this number if you have problems the morning of surgery [phone]    Remember: Do not eat food or drink liquids :After Midnight. BRUSH YOUR TEETH MORNING OF SURGERY AND RINSE YOUR MOUTH OUT, NO CHEWING GUM CANDY OR MINTS.     Take these medicines the morning of surgery with A SIP OF WATER: flomax, nexium, bentyl, bisoprolol                                You may not have any metal on your body including hair pins and              piercings  Do not wear jewelry, make-up, lotions, powders or perfumes, deodorant                          Men may shave face and neck.   Do not bring valuables to the hospital. [place].  Contacts, dentures or bridgework may not be worn into surgery.      ___________________________________________________________________           [facility] - Preparing for Surgery Before surgery, you can play an important role.  Because skin is not sterile, your skin needs to be as free of germs as possible.  You can reduce the number of germs on your skin by washing with CHG (chlorahexidine gluconate) soap before surgery.  CHG is an antiseptic cleaner which kills germs and bonds with the skin to continue killing germs even after washing. Please DO NOT use if you have an allergy to CHG or antibacterial soaps.  If your skin becomes reddened/irritated stop using the CHG and inform your nurse when you arrive at Short Stay. Do not shave (including legs and underarms) for at least 48  hours prior to the first CHG shower.  You may shave your face/neck. Please follow these instructions carefully:  1.  Shower with CHG Soap the night before surgery and the  morning of Surgery.  2.  If you choose to wash your hair, wash your hair first as usual with your  normal  shampoo.  3.  After you shampoo, rinse your hair and body thoroughly to remove the  shampoo.                           4.  Use CHG as you would any other liquid soap.  You can apply chg directly  to the skin and wash                       Gently with a scrungie or clean washcloth.  5.  Apply the CHG Soap to your body ONLY FROM THE NECK DOWN.   Do not use on face/ open                           Wound or open sores. Avoid contact with eyes, ears mouth and genitals (private parts).                       Wash face,  Genitals (private parts) with your normal soap.             6.  Wash thoroughly, paying special attention to the area where your surgery  will be performed.  7.  Thoroughly rinse your body with warm water from the neck down.  8.  DO NOT shower/wash with your normal soap after using and rinsing off  the CHG Soap.                9.  Pat yourself dry with a clean towel.            10.  Wear clean pajamas.            11.  Place clean sheets on your bed the night of your first shower and do not  sleep with pets. Day of Surgery : Do not apply any lotions/deodorants the morning of surgery.  Please wear clean clothes to the hospital/surgery center.  FAILURE TO FOLLOW THESE INSTRUCTIONS MAY RESULT IN THE CANCELLATION OF YOUR SURGERY PATIENT SIGNATURE_________________________________  NURSE SIGNATURE__________________________________  ________________________________________________________________________

## 2019-03-17 NOTE — Progress Notes (Signed)
[facility] DR Jannifer Franklin 04-23-18 EPIC

## 2019-03-18 ENCOUNTER — Encounter (HOSPITAL_COMMUNITY)
Admission: RE | Admit: 2019-03-18 | Discharge: 2019-03-18 | Disposition: A | Discharge status: Home / Self Care | Payer: Medicare Other | Source: Ambulatory Visit | Attending: General Surgery | Admitting: General Surgery

## 2019-03-18 ENCOUNTER — Encounter (HOSPITAL_COMMUNITY): Payer: Self-pay

## 2019-03-18 ENCOUNTER — Other Ambulatory Visit: Payer: Self-pay

## 2019-03-18 DIAGNOSIS — Z01818 Encounter for other preprocedural examination: Secondary | ICD-10-CM | POA: Diagnosis present

## 2019-03-18 DIAGNOSIS — K4020 Bilateral inguinal hernia, without obstruction or gangrene, not specified as recurrent: Secondary | ICD-10-CM | POA: Diagnosis not present

## 2019-03-18 DIAGNOSIS — I10 Essential (primary) hypertension: Secondary | ICD-10-CM | POA: Insufficient documentation

## 2019-03-18 HISTORY — DX: Sleep apnea, unspecified: G47.30

## 2019-03-18 HISTORY — DX: Tachycardia, unspecified: R00.0

## 2019-03-18 HISTORY — DX: Chronic kidney disease, stage 3 unspecified: N18.30

## 2019-03-18 HISTORY — DX: Essential (primary) hypertension: I10

## 2019-03-18 HISTORY — DX: Family history of other specified conditions: Z84.89

## 2019-03-18 HISTORY — DX: Pneumonia, unspecified organism: J18.9

## 2019-03-18 LAB — CBC WITH DIFFERENTIAL/PLATELET
Abs Immature Granulocytes: 0.01 10*3/uL (ref 0.00–0.07)
Basophils Absolute: 0 10*3/uL (ref 0.0–0.1)
Basophils Relative: 1 %
Eosinophils Absolute: 0.1 10*3/uL (ref 0.0–0.5)
Eosinophils Relative: 2 %
HCT: 37.1 % — ABNORMAL LOW (ref 39.0–52.0)
Hemoglobin: 12.1 g/dL — ABNORMAL LOW (ref 13.0–17.0)
Immature Granulocytes: 0 %
Lymphocytes Relative: 36 %
Lymphs Abs: 1.8 10*3/uL (ref 0.7–4.0)
MCH: 31.6 pg (ref 26.0–34.0)
MCHC: 32.6 g/dL (ref 30.0–36.0)
MCV: 96.9 fL (ref 80.0–100.0)
Monocytes Absolute: 0.5 10*3/uL (ref 0.1–1.0)
Monocytes Relative: 9 %
Neutro Abs: 2.6 10*3/uL (ref 1.7–7.7)
Neutrophils Relative %: 52 %
Platelets: 161 10*3/uL (ref 150–400)
RBC: 3.83 MIL/uL — ABNORMAL LOW (ref 4.22–5.81)
RDW: 14.1 % (ref 11.5–15.5)
WBC: 5.1 10*3/uL (ref 4.0–10.5)
nRBC: 0 % (ref 0.0–0.2)

## 2019-03-18 LAB — COMPREHENSIVE METABOLIC PANEL
ALT: 12 U/L (ref 0–44)
AST: 20 U/L (ref 15–41)
Albumin: 4.3 g/dL (ref 3.5–5.0)
Alkaline Phosphatase: 98 U/L (ref 38–126)
Anion gap: 7 (ref 5–15)
BUN: 29 mg/dL — ABNORMAL HIGH (ref 8–23)
CO2: 26 mmol/L (ref 22–32)
Calcium: 8.8 mg/dL — ABNORMAL LOW (ref 8.9–10.3)
Chloride: 102 mmol/L (ref 98–111)
Creatinine, Ser: 1.6 mg/dL — ABNORMAL HIGH (ref 0.61–1.24)
GFR calc Af Amer: 47 mL/min — ABNORMAL LOW (ref >60)
GFR calc non Af Amer: 41 mL/min — ABNORMAL LOW (ref >60)
Glucose, Bld: 79 mg/dL (ref 70–99)
Potassium: 4.2 mmol/L (ref 3.5–5.1)
Sodium: 135 mmol/L (ref 135–145)
Total Bilirubin: 0.4 mg/dL (ref 0.3–1.2)
Total Protein: 6.9 g/dL (ref 6.5–8.1)

## 2019-03-19 ENCOUNTER — Other Ambulatory Visit (HOSPITAL_COMMUNITY)
Admission: RE | Admit: 2019-03-19 | Discharge: 2019-03-19 | Disposition: A | Discharge status: Home / Self Care | Payer: Medicare Other | Source: Ambulatory Visit | Attending: General Surgery | Admitting: General Surgery

## 2019-03-19 ENCOUNTER — Encounter (HOSPITAL_COMMUNITY): Payer: Self-pay

## 2019-03-19 DIAGNOSIS — Z1159 Encounter for screening for other viral diseases: Secondary | ICD-10-CM | POA: Insufficient documentation

## 2019-03-19 LAB — SARS CORONAVIRUS 2 (TAT 6-24 HRS): SARS Coronavirus 2: NEGATIVE

## 2019-03-19 NOTE — H&P (Signed)
Reinaldo Berber Location: [facility] Patient #: [id number] DOB: July 02, 1941 Married / Language: English / Race: White Male       History of Present Illness   This is a very pleasant 78 year old man who returns stating that he is now ready to have his bilateral inguinal hernias repaired and that he would like to do this laparoscopically. Kathryne Eriksson is his PCP and he originally referred the patient in August 2019 his wife is a breast cancer patient of mine.       He's noted a right groin bulge for almost 3 years now with occasional pain. Getting larger. Over the past 10 months they're slightly larger and slightly more painful but has never been incarcerated.      Past history revealed degenerative disc disease. He's had lumbar surgery as well as cervical spine surgery. He has chronic pain and chronic constipation. He is a chronic pain management and Dr. Kathryne Eriksson manages that with oxycodone. BPH. He says he has irritable bowel syndrome mostly constipation. Hypertension. Has had a circumcision but otherwise no abdominal or inguinal surgery. He doesn't have any cerebrovascular disease, cardiac disease, pulmonary disease or diabetes. Mother deceased age 11. Father deceased age 82 after a fall and pneumonia. Social history reveals he is married. His wife is a breast cancer patient of mine. One son died at age 57 of leukemia. One daughter living. He denies tobacco. Retired from [employer] and also worked for [employer].     Exam reveals a larger right and a smaller left inguinal hernia. He is thin I think he is a good candidate for laparoscopic repair. That is his preference. I compared open repair and laparoscopic repair as well as long-term results and risks. He will be scheduled for laparoscopic repair of bilateral inguinal hernias with mesh at [facility] long. Overnight stay. I discussed the indications, details, techniques, and numerous risks with him. He  is aware the risk of bleeding, infection, recurrence, conversion to open surgery, injury to adjacent organs such as the intestine bladder or testicle with major reconstructive surgery, chronic pain, urinary retention, cardiac pulmonary and thromboembolic problems. He understands these issues well. All of his questions are answered. He agrees with this plan.   Allergies  NSAIDs  Augmentin *PENICILLINS*  Erythromycin *MACROLIDES*  Levaquin *FLUOROQUINOLONES*  Bisphosphonates  Allergies Reconciled   Medication History  DiazePAM (10MG  Tablet, Oral) Active. Topiramate (25MG  Tablet, Oral) Active. Tamsulosin HCl (0.4MG  Capsule, Oral) Active. Esomeprazole Magnesium (40MG  Capsule DR, Oral) Active. Bisoprolol Fumarate (10MG  Tablet, Oral) Active. Dicyclomine HCl (20MG  Tablet, Oral) Active. ValACYclovir HCl (1GM Tablet, Oral) Active. Acetaminophen (325MG  Tablet, Oral) Active. Magnesium (500MG  Tablet, Oral) Active. Lidocaine Viscous HCl (2% Solution, Mouth/Throat) Active. Triamcinolone Acetonide (0.1% Paste, Mouth/Throat) Active. Medications Reconciled  Vitals  Weight: 129 lb Height: 69in Body Surface Area: 1.71 m Body Mass Index: 19.05 kg/m  Temp.: 98.42F  Pulse: 78 (Regular)  BP: 128/78(Sitting, Left Arm, Standard)       Physical Exam General Mental Status-Alert. General Appearance-Consistent with stated age. Hydration-Well hydrated. Voice-Normal. Note: BMI 19.   Head and Neck Head-normocephalic, atraumatic with no lesions or palpable masses. Trachea-midline. Thyroid Gland Characteristics - normal size and consistency.  Eye Eyeball - Bilateral-Extraocular movements intact. Sclera/Conjunctiva - Bilateral-No scleral icterus.  Chest and Lung Exam Chest and lung exam reveals -quiet, even and easy respiratory effort with no use of accessory muscles and on auscultation, normal breath sounds, no adventitious sounds and normal  vocal resonance. Inspection Chest Wall - Normal.  Back - normal.  Cardiovascular Cardiovascular examination reveals -normal heart sounds, regular rate and rhythm with no murmurs and normal pedal pulses bilaterally.  Abdomen Inspection Inspection of the abdomen reveals - No Hernias. Skin - Scar - no surgical scars. Palpation/Percussion Palpation and Percussion of the abdomen reveal - Soft, Non Tender, No Rebound tenderness, No Rigidity (guarding) and No hepatosplenomegaly. Auscultation Auscultation of the abdomen reveals - Bowel sounds normal.  Male Genitourinary Note: Examined supine and standing. Right inguinal hernia is probably indirect. Starts up lateral and goes down toward the external ring. Left inguinal hernia is smaller. No scrotal or testicular mass. Umbilicus normal. No scars on the abdomen   Neurologic Neurologic evaluation reveals -alert and oriented x 3 with no impairment of recent or remote memory. Mental Status-Normal.  Musculoskeletal Normal Exam - Left-Upper Extremity Strength Normal and Lower Extremity Strength Normal. Normal Exam - Right-Upper Extremity Strength Normal and Lower Extremity Strength Normal.  Lymphatic Head & Neck  General Head & Neck Lymphatics: Bilateral - Description - Normal. Axillary  General Axillary Region: Bilateral - Description - Normal. Tenderness - Non Tender. Femoral & Inguinal  Generalized Femoral & Inguinal Lymphatics: Bilateral - Description - Normal. Tenderness - Non Tender.    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 you state that she would like to have this done laparoscopically, and I think that is a good choice in your situation   you will be scheduled for laparoscopic repair of bilateral inguinal hernias with mesh [facility] and keep you one night just to be sure you're okay We're talked about the indications, techniques, and risk of this in detail   HYPERTENSION, ESSENTIAL (I10)  CHRONIC PAIN SYNDROME (G89.4)  HISTORY OF BPH (Z87.438)  DEGENERATIVE DISC DISEASE, LUMBAR (M51.36)  IRRITABLE BOWEL SYNDROME WITH CONSTIPATION (K58.1)  CKD (CHRONIC KIDNEY DISEASE) STAGE 3, GFR 30-59 ML/MIN (N18.3)    Darick Fetters M. Derrell LollingIngram, M.D., [facility] General and Minimally invasive Surgery Breast and Colorectal Surgery Office:   [phone] Pager:   [phone]

## 2019-03-19 NOTE — Progress Notes (Signed)
Anesthesia Chart Review:   Case: [id number] Date/Time: 03/23/19 0715   Procedure: LAPAROSCOPIC INGUINAL HERNIA REPAIR WITH MESH (Bilateral )   Anesthesia type: General   Pre-op diagnosis: BILATERAL INGUINAL HERNIA   Location: [facility] ROOM 04 / [facility]   Surgeon: Claud KelpIngram, Haywood, MD      DISCUSSION:  Pt is a 78 year old male with hx HTN, OSA, CKD (stage 3).    VS: BP 103/70   Pulse (!) 56   Temp 37.1 C (Oral)   Resp 20   Ht 5\' 11"  (1.803 m)   Wt 56.8 kg   SpO2 100%   BMI 17.48 kg/m    PROVIDERS: - PCP is Barbie BannerWilson, Fred H, MD   LABS: Labs reviewed: Acceptable for surgery.  (all labs ordered are listed, but only abnormal results are displayed)  Labs Reviewed  CBC WITH DIFFERENTIAL/PLATELET - Abnormal; Notable for the following components:      Result Value   RBC 3.83 (*)    Hemoglobin 12.1 (*)    HCT 37.1 (*)    All other components within normal limits  COMPREHENSIVE METABOLIC PANEL - Abnormal; Notable for the following components:   BUN 29 (*)    Creatinine, Ser 1.60 (*)    Calcium 8.8 (*)    GFR calc non Af Amer 41 (*)    GFR calc Af Amer 47 (*)    All other components within normal limits    EKG 03/18/19: Sinus bradycardia. First degree heart block   Past Medical History:  Diagnosis Date  . Arthritis    DEGENERATIVE  . Chronic pain syndrome   . CKD (chronic kidney disease) stage 3, GFR 30-59 ml/min (HCC)   . Family history of adverse reaction to anesthesia    mother got headaches and [place]  . Hypertension   . IBS (irritable bowel syndrome)   . Increased heart rate    at times pulse gets fast per pt.  . Migraine without aura, without mention of intractable migraine without mention of status migrainosus 10/12/2013  . Osteoporosis   . Pneumonia    x2  . Sleep apnea    mild no mask needed     Past Surgical History:  Procedure Laterality Date  . CERVICAL FUSION     with titaanium plates limited movement  neck dosent go back or side to side   c3-c5  2005   . ELBOW FRACTURE SURGERY Right   . HEMORRHOID SURGERY    . LUMBOSACRAL SURGERY     laminectomy x3 l4, l5  . SINUS EXPLORATION     X3  . SPINE SURGERY      MEDICATIONS: . bisoprolol (ZEBETA) 10 MG tablet  . Calcium Carbonate-Vitamin D (CALTRATE 600+D) 600-400 MG-UNIT per tablet  . diazepam (VALIUM) 10 MG tablet  . dicyclomine (BENTYL) 20 MG tablet  . lidocaine (XYLOCAINE) 2 % solution  . NEXIUM 40 MG capsule  . oxyCODONE-acetaminophen (PERCOCET) 10-325 MG per tablet  . promethazine (PHENERGAN) 25 MG tablet  . tamsulosin (FLOMAX) 0.4 MG CAPS capsule  . topiramate (TOPAMAX) 25 MG tablet  . traMADol (ULTRAM) 50 MG tablet  . triamcinolone (KENALOG) 0.1 % paste  . UNABLE TO FIND  . valACYclovir (VALTREX) 1000 MG tablet  . vitamin B-12 (CYANOCOBALAMIN) 1000 MCG tablet   No current facility-administered medications for this encounter.     If no changes, I anticipate pt can proceed with surgery as scheduled.   Rica MastAngela Kabbe, FNP-BC [facility]/Anesthesiology Phone: [phone]  03/19/2019 2:37 PM

## 2019-03-19 NOTE — Anesthesia Preprocedure Evaluation (Addendum)
Anesthesia Evaluation  Patient identified by MRN, date of birth, ID band Patient awake    Reviewed: Allergy & Precautions, NPO status , Patient's Chart, lab work & pertinent test results  Airway Mallampati: II  TM Distance: >3 FB Neck ROM: Full    Dental no notable dental hx.    Pulmonary neg pulmonary ROS, former smoker,    Pulmonary exam normal breath sounds clear to auscultation       Cardiovascular hypertension, negative cardio ROS Normal cardiovascular exam Rhythm:Regular Rate:Normal     Neuro/Psych negative neurological ROS  negative psych ROS   GI/Hepatic negative GI ROS, Neg liver ROS,   Endo/Other  negative endocrine ROS  Renal/GU Renal disease  negative genitourinary   Musculoskeletal negative musculoskeletal ROS (+)   Abdominal   Peds negative pediatric ROS (+)  Hematology negative hematology ROS (+)   Anesthesia Other Findings   Reproductive/Obstetrics negative OB ROS                            Anesthesia Physical Anesthesia Plan  ASA: II  Anesthesia Plan: General   Post-op Pain Management:    Induction: Intravenous  PONV Risk Score and Plan: 2 and Ondansetron, Dexamethasone and Treatment may vary due to age or medical condition  Airway Management Planned: Oral ETT  Additional Equipment:   Intra-op Plan:   Post-operative Plan: Extubation in OR  Informed Consent: I have reviewed the patients History and Physical, chart, labs and discussed the procedure including the risks, benefits and alternatives for the proposed anesthesia with the patient or authorized representative who has indicated his/her understanding and acceptance.     Dental advisory given  Plan Discussed with: CRNA and Surgeon  Anesthesia Plan Comments: (See APP note by Willeen Cass, FNP)       Anesthesia Quick Evaluation

## 2019-03-23 ENCOUNTER — Ambulatory Visit (HOSPITAL_COMMUNITY): Payer: Medicare Other | Admitting: Certified Registered"

## 2019-03-23 ENCOUNTER — Ambulatory Visit (HOSPITAL_COMMUNITY)
Admission: RE | Admit: 2019-03-23 | Discharge: 2019-03-24 | Disposition: A | Discharge status: Home / Self Care | Payer: Medicare Other | Attending: General Surgery | Admitting: General Surgery

## 2019-03-23 ENCOUNTER — Encounter (HOSPITAL_COMMUNITY): Payer: Self-pay | Admitting: General Practice

## 2019-03-23 ENCOUNTER — Other Ambulatory Visit: Payer: Self-pay

## 2019-03-23 ENCOUNTER — Ambulatory Visit (HOSPITAL_COMMUNITY): Payer: Medicare Other | Admitting: Emergency Medicine

## 2019-03-23 ENCOUNTER — Encounter (HOSPITAL_COMMUNITY)
Admission: RE | Disposition: A | Discharge status: Home / Self Care | Payer: Self-pay | Source: Home / Self Care | Attending: General Surgery

## 2019-03-23 DIAGNOSIS — N183 Chronic kidney disease, stage 3 unspecified: Secondary | ICD-10-CM | POA: Diagnosis present

## 2019-03-23 DIAGNOSIS — N400 Enlarged prostate without lower urinary tract symptoms: Secondary | ICD-10-CM | POA: Diagnosis present

## 2019-03-23 DIAGNOSIS — Z79899 Other long term (current) drug therapy: Secondary | ICD-10-CM | POA: Insufficient documentation

## 2019-03-23 DIAGNOSIS — Z87891 Personal history of nicotine dependence: Secondary | ICD-10-CM | POA: Diagnosis not present

## 2019-03-23 DIAGNOSIS — G894 Chronic pain syndrome: Secondary | ICD-10-CM | POA: Diagnosis not present

## 2019-03-23 DIAGNOSIS — M5136 Other intervertebral disc degeneration, lumbar region: Secondary | ICD-10-CM | POA: Insufficient documentation

## 2019-03-23 DIAGNOSIS — K581 Irritable bowel syndrome with constipation: Secondary | ICD-10-CM | POA: Insufficient documentation

## 2019-03-23 DIAGNOSIS — I10 Essential (primary) hypertension: Secondary | ICD-10-CM | POA: Diagnosis present

## 2019-03-23 DIAGNOSIS — I129 Hypertensive chronic kidney disease with stage 1 through stage 4 chronic kidney disease, or unspecified chronic kidney disease: Secondary | ICD-10-CM | POA: Insufficient documentation

## 2019-03-23 DIAGNOSIS — K4020 Bilateral inguinal hernia, without obstruction or gangrene, not specified as recurrent: Secondary | ICD-10-CM | POA: Diagnosis present

## 2019-03-23 HISTORY — DX: Chronic kidney disease, stage 3 unspecified: N18.30

## 2019-03-23 HISTORY — DX: Benign prostatic hyperplasia without lower urinary tract symptoms: N40.0

## 2019-03-23 HISTORY — PX: INGUINAL HERNIA REPAIR: SHX194

## 2019-03-23 HISTORY — DX: Essential (primary) hypertension: I10

## 2019-03-23 SURGERY — REPAIR, HERNIA, INGUINAL, LAPAROSCOPIC
Anesthesia: General | Site: Ankle | Laterality: Bilateral

## 2019-03-23 MED ORDER — BUPIVACAINE-EPINEPHRINE (PF) 0.5% -1:200000 IJ SOLN
INTRAMUSCULAR | Status: AC
  Filled 2019-03-23: qty 30

## 2019-03-23 MED ORDER — CHLORHEXIDINE GLUCONATE CLOTH 2 % EX PADS: 6.0000 | MEDICATED_PAD | Freq: Once | CUTANEOUS | Status: DC

## 2019-03-23 MED ORDER — SUGAMMADEX SODIUM 200 MG/2ML IV SOLN
INTRAVENOUS | Status: DC | PRN
  Administered 2019-03-23: 250 mg via INTRAVENOUS

## 2019-03-23 MED ORDER — DICYCLOMINE HCL 20 MG PO TABS
20.0000 mg | ORAL_TABLET | Freq: Two times a day (BID) | ORAL | Status: DC
  Administered 2019-03-23 – 2019-03-24 (×2): 20 mg via ORAL
  Filled 2019-03-23 (×3): qty 1

## 2019-03-23 MED ORDER — LIDOCAINE 2% (20 MG/ML) 5 ML SYRINGE
INTRAMUSCULAR | Status: AC
  Filled 2019-03-23: qty 5

## 2019-03-23 MED ORDER — LIDOCAINE VISCOUS HCL 2 % MT SOLN: 1.0000 mL | Freq: Four times a day (QID) | OROMUCOSAL | Status: DC | PRN

## 2019-03-23 MED ORDER — ONDANSETRON 4 MG PO TBDP: 4.0000 mg | ORAL_TABLET | Freq: Four times a day (QID) | ORAL | Status: DC | PRN

## 2019-03-23 MED ORDER — BUPIVACAINE-EPINEPHRINE 0.5% -1:200000 IJ SOLN
INTRAMUSCULAR | Status: AC
  Filled 2019-03-23: qty 1

## 2019-03-23 MED ORDER — LACTATED RINGERS IV SOLN
INTRAVENOUS | Status: DC
  Administered 2019-03-23: 07:00:00 via INTRAVENOUS

## 2019-03-23 MED ORDER — SODIUM CHLORIDE 0.9 % IV SOLN
INTRAVENOUS | Status: DC
  Administered 2019-03-23: 13:00:00 via INTRAVENOUS

## 2019-03-23 MED ORDER — ENOXAPARIN SODIUM 40 MG/0.4ML ~~LOC~~ SOLN
40.0000 mg | SUBCUTANEOUS | Status: DC
  Administered 2019-03-24: 40 mg via SUBCUTANEOUS
  Filled 2019-03-23: qty 0.4

## 2019-03-23 MED ORDER — BUPIVACAINE-EPINEPHRINE (PF) 0.25% -1:200000 IJ SOLN
INTRAMUSCULAR | Status: AC
  Filled 2019-03-23: qty 30

## 2019-03-23 MED ORDER — PROPOFOL 500 MG/50ML IV EMUL
INTRAVENOUS | Status: DC | PRN
  Administered 2019-03-23: 25 ug/kg/min via INTRAVENOUS

## 2019-03-23 MED ORDER — PROMETHAZINE HCL 25 MG PO TABS: 25.0000 mg | ORAL_TABLET | Freq: Every evening | ORAL | Status: DC | PRN

## 2019-03-23 MED ORDER — ALBUMIN HUMAN 5 % IV SOLN
INTRAVENOUS | Status: AC
  Filled 2019-03-23: qty 250

## 2019-03-23 MED ORDER — FENTANYL CITRATE (PF) 100 MCG/2ML IJ SOLN
INTRAMUSCULAR | Status: DC | PRN
  Administered 2019-03-23 (×3): 25 ug via INTRAVENOUS

## 2019-03-23 MED ORDER — GLYCOPYRROLATE 0.2 MG/ML IJ SOLN
INTRAMUSCULAR | Status: DC | PRN
  Administered 2019-03-23: 0.2 mg via INTRAVENOUS

## 2019-03-23 MED ORDER — OXYCODONE HCL 5 MG PO TABS: 5.0000 mg | ORAL_TABLET | Freq: Once | ORAL | Status: DC | PRN

## 2019-03-23 MED ORDER — PROMETHAZINE HCL 25 MG/ML IJ SOLN: 6.2500 mg | INTRAMUSCULAR | Status: DC | PRN

## 2019-03-23 MED ORDER — LIDOCAINE 2% (20 MG/ML) 5 ML SYRINGE
INTRAMUSCULAR | Status: DC | PRN
Start: 2019-03-23 — End: 2019-03-23
  Administered 2019-03-23: 60 mg via INTRAVENOUS

## 2019-03-23 MED ORDER — SENNA 8.6 MG PO TABS
1.0000 | ORAL_TABLET | Freq: Two times a day (BID) | ORAL | Status: DC
  Administered 2019-03-23 – 2019-03-24 (×2): 8.6 mg via ORAL
  Filled 2019-03-23 (×2): qty 1

## 2019-03-23 MED ORDER — BISOPROLOL FUMARATE 10 MG PO TABS
10.0000 mg | ORAL_TABLET | Freq: Every day | ORAL | Status: DC
  Administered 2019-03-24: 10 mg via ORAL
  Filled 2019-03-23: qty 1

## 2019-03-23 MED ORDER — HYDROMORPHONE HCL 1 MG/ML IJ SOLN
INTRAMUSCULAR | Status: AC
  Filled 2019-03-23: qty 1

## 2019-03-23 MED ORDER — TRAMADOL HCL 50 MG PO TABS: 50.0000 mg | ORAL_TABLET | Freq: Four times a day (QID) | ORAL | Status: DC | PRN

## 2019-03-23 MED ORDER — LACTATED RINGERS IR SOLN
Status: DC | PRN
  Administered 2019-03-23: 1000 mL

## 2019-03-23 MED ORDER — PROPOFOL 10 MG/ML IV BOLUS
INTRAVENOUS | Status: AC
  Filled 2019-03-23: qty 20

## 2019-03-23 MED ORDER — ROCURONIUM BROMIDE 100 MG/10ML IV SOLN
INTRAVENOUS | Status: DC | PRN
  Administered 2019-03-23: 60 mg via INTRAVENOUS
  Administered 2019-03-23 (×2): 20 mg via INTRAVENOUS

## 2019-03-23 MED ORDER — ALBUMIN HUMAN 5 % IV SOLN
INTRAVENOUS | Status: DC | PRN
  Administered 2019-03-23 (×2): via INTRAVENOUS

## 2019-03-23 MED ORDER — DEXAMETHASONE SODIUM PHOSPHATE 10 MG/ML IJ SOLN
INTRAMUSCULAR | Status: AC
  Filled 2019-03-23: qty 1

## 2019-03-23 MED ORDER — EPHEDRINE SULFATE-NACL 50-0.9 MG/10ML-% IV SOSY
PREFILLED_SYRINGE | INTRAVENOUS | Status: DC | PRN
  Administered 2019-03-23 (×8): 10 mg via INTRAVENOUS

## 2019-03-23 MED ORDER — ONDANSETRON HCL 4 MG/2ML IJ SOLN
INTRAMUSCULAR | Status: AC
  Filled 2019-03-23: qty 2

## 2019-03-23 MED ORDER — PROPOFOL 10 MG/ML IV BOLUS
INTRAVENOUS | Status: DC | PRN
  Administered 2019-03-23: 100 mg via INTRAVENOUS

## 2019-03-23 MED ORDER — ESMOLOL HCL 100 MG/10ML IV SOLN
INTRAVENOUS | Status: AC
  Filled 2019-03-23: qty 10

## 2019-03-23 MED ORDER — ONDANSETRON HCL 4 MG/2ML IJ SOLN: 4.0000 mg | Freq: Four times a day (QID) | INTRAMUSCULAR | Status: DC | PRN

## 2019-03-23 MED ORDER — FENTANYL CITRATE (PF) 250 MCG/5ML IJ SOLN
INTRAMUSCULAR | Status: AC
  Filled 2019-03-23: qty 5

## 2019-03-23 MED ORDER — TAMSULOSIN HCL 0.4 MG PO CAPS
0.4000 mg | ORAL_CAPSULE | Freq: Every day | ORAL | Status: DC
  Administered 2019-03-24: 0.4 mg via ORAL
  Filled 2019-03-23: qty 1

## 2019-03-23 MED ORDER — ACETAMINOPHEN 500 MG PO TABS
1000.0000 mg | ORAL_TABLET | ORAL | Status: AC
  Administered 2019-03-23: 1000 mg via ORAL
  Filled 2019-03-23: qty 2

## 2019-03-23 MED ORDER — TOPIRAMATE 25 MG PO TABS
50.0000 mg | ORAL_TABLET | Freq: Every day | ORAL | Status: DC
  Administered 2019-03-23: 50 mg via ORAL
  Filled 2019-03-23: qty 2

## 2019-03-23 MED ORDER — CEFAZOLIN SODIUM-DEXTROSE 2-4 GM/100ML-% IV SOLN
2.0000 g | INTRAVENOUS | Status: AC
  Administered 2019-03-23: 2 g via INTRAVENOUS
  Filled 2019-03-23: qty 100

## 2019-03-23 MED ORDER — ONDANSETRON HCL 4 MG/2ML IJ SOLN
INTRAMUSCULAR | Status: DC | PRN
  Administered 2019-03-23: 4 mg via INTRAVENOUS

## 2019-03-23 MED ORDER — BUPIVACAINE-EPINEPHRINE 0.5% -1:200000 IJ SOLN
INTRAMUSCULAR | Status: DC | PRN
  Administered 2019-03-23: 28 mL

## 2019-03-23 MED ORDER — ROCURONIUM BROMIDE 10 MG/ML (PF) SYRINGE
PREFILLED_SYRINGE | INTRAVENOUS | Status: AC
  Filled 2019-03-23: qty 10

## 2019-03-23 MED ORDER — BUPIVACAINE LIPOSOME 1.3 % IJ SUSP
20.0000 mL | Freq: Once | INTRAMUSCULAR | Status: AC
  Administered 2019-03-23: 19 mL
  Filled 2019-03-23: qty 20

## 2019-03-23 MED ORDER — EPHEDRINE 5 MG/ML INJ
INTRAVENOUS | Status: AC
  Filled 2019-03-23: qty 10

## 2019-03-23 MED ORDER — VITAMIN B-12 1000 MCG PO TABS
1000.0000 ug | ORAL_TABLET | Freq: Every day | ORAL | Status: DC
  Administered 2019-03-24: 1000 ug via ORAL
  Filled 2019-03-23: qty 1

## 2019-03-23 MED ORDER — HYDROMORPHONE HCL 1 MG/ML IJ SOLN
0.5000 mg | INTRAMUSCULAR | Status: DC | PRN
  Administered 2019-03-23 (×5): 0.5 mg via INTRAVENOUS
  Filled 2019-03-23: qty 0.5

## 2019-03-23 MED ORDER — CALCIUM CARBONATE-VITAMIN D 500-200 MG-UNIT PO TABS
1.0000 | ORAL_TABLET | Freq: Every day | ORAL | Status: DC
  Administered 2019-03-24: 1 via ORAL
  Filled 2019-03-23: qty 1

## 2019-03-23 MED ORDER — OXYCODONE HCL 5 MG/5ML PO SOLN: 5.0000 mg | Freq: Once | ORAL | Status: DC | PRN

## 2019-03-23 MED ORDER — GABAPENTIN 300 MG PO CAPS
300.0000 mg | ORAL_CAPSULE | ORAL | Status: AC
  Administered 2019-03-23: 300 mg via ORAL
  Filled 2019-03-23: qty 1

## 2019-03-23 MED ORDER — PANTOPRAZOLE SODIUM 40 MG PO TBEC
40.0000 mg | DELAYED_RELEASE_TABLET | Freq: Every day | ORAL | Status: DC
  Administered 2019-03-24: 40 mg via ORAL
  Filled 2019-03-23: qty 1

## 2019-03-23 MED ORDER — HYDROMORPHONE HCL 1 MG/ML IJ SOLN: 0.2500 mg | INTRAMUSCULAR | Status: DC | PRN

## 2019-03-23 MED ORDER — 0.9 % SODIUM CHLORIDE (POUR BTL) OPTIME
TOPICAL | Status: DC | PRN
  Administered 2019-03-23: 1000 mL

## 2019-03-23 MED ORDER — HYDROCODONE-ACETAMINOPHEN 5-325 MG PO TABS
1.0000 | ORAL_TABLET | ORAL | Status: DC | PRN
  Administered 2019-03-23 – 2019-03-24 (×2): 2 via ORAL
  Filled 2019-03-23 (×2): qty 2

## 2019-03-23 SURGICAL SUPPLY — 32 items
ADH SKN CLS APL DERMABOND .7 (GAUZE/BANDAGES/DRESSINGS) ×1
APPLIER CLIP 5 13 M/L LIGAMAX5 (MISCELLANEOUS) ×3
APR CLP MED LRG 5 ANG JAW (MISCELLANEOUS) ×1
CABLE HIGH FREQUENCY MONO STRZ (ELECTRODE) ×2 IMPLANT
CLIP APPLIE 5 13 M/L LIGAMAX5 (MISCELLANEOUS) IMPLANT
COVER SURGICAL LIGHT HANDLE (MISCELLANEOUS) ×3 IMPLANT
DECANTER SPIKE VIAL GLASS SM (MISCELLANEOUS) ×3 IMPLANT
DERMABOND ADVANCED (GAUZE/BANDAGES/DRESSINGS) ×2
DERMABOND ADVANCED .7 DNX12 (GAUZE/BANDAGES/DRESSINGS) IMPLANT
DEVICE SECURE STRAP 25 ABSORB (INSTRUMENTS) ×2 IMPLANT
DISSECT BALLN SPACEMKR + OVL (BALLOONS) ×3
DISSECTOR BALLN SPACEMKR + OVL (BALLOONS) ×1 IMPLANT
ELECT REM PT RETURN 15FT ADLT (MISCELLANEOUS) ×3 IMPLANT
GLOVE EUDERMIC 7 POWDERFREE (GLOVE) ×3 IMPLANT
GOWN STRL REUS W/TWL XL LVL3 (GOWN DISPOSABLE) ×7 IMPLANT
KIT BASIN OR (CUSTOM PROCEDURE TRAY) ×3 IMPLANT
KIT TURNOVER KIT A (KITS) IMPLANT
MESH 3DMAX LIGHT 4.1X6.2 LT LR (Mesh General) ×2 IMPLANT
MESH 3DMAX LIGHT 4.1X6.2 RT LR (Mesh General) ×2 IMPLANT
NDL INSUFFLATION 14GA 120MM (NEEDLE) IMPLANT
NEEDLE INSUFFLATION 14GA 120MM (NEEDLE) IMPLANT
PROTECTOR NERVE ULNAR (MISCELLANEOUS) ×2 IMPLANT
SCISSORS LAP 5X35 DISP (ENDOMECHANICALS) ×2 IMPLANT
SET IRRIG TUBING LAPAROSCOPIC (IRRIGATION / IRRIGATOR) ×2 IMPLANT
SUT MNCRL AB 4-0 PS2 18 (SUTURE) ×3 IMPLANT
SUT VIC AB 3-0 SH 27 (SUTURE)
SUT VIC AB 3-0 SH 27XBRD (SUTURE) IMPLANT
TOWEL OR 17X26 10 PK STRL BLUE (TOWEL DISPOSABLE) ×3 IMPLANT
TOWEL OR NON WOVEN STRL DISP B (DISPOSABLE) ×3 IMPLANT
TRAY FOLEY MTR SLVR 14FR STAT (SET/KITS/TRAYS/PACK) ×2 IMPLANT
TRAY LAPAROSCOPIC (CUSTOM PROCEDURE TRAY) ×3 IMPLANT
TROCAR CANNULA W/PORT DUAL 5MM (MISCELLANEOUS) ×5 IMPLANT

## 2019-03-23 NOTE — Op Note (Signed)
Patient Name:           Jesus Alvarez   Date of Surgery:        03/23/2019  Pre op Diagnosis:      Bilateral inguinal hernia  Post op Diagnosis:    Same  Procedure:                 Laparoscopic, preperitoneal repair of bilateral inguinal hernias                                      Bilateral implantation 3D max mesh, light weight                                       Bilateral laparoscopic assisted transabdominal preperitoneal ilioinguinal and iliohypogastric nerve block with Exparel.  Surgeon:                     Edsel Petrin. Dalbert Batman, M.D., FACS  Assistant:                      OR staff  Operative Indications:    This is a very pleasant 78 year old man who presents for elective repair of bilateral inguinal hernias.   Jesus Alvarez is his PCP and he originally referred the patient in August 2019 his wife is a breast cancer patient of mine.       He's noted a right groin bulge for almost 3 years now with occasional pain. Getting larger. Over the past 10 months they're slightly larger and slightly more painful but has never been incarcerated.     Exam reveals a larger right and a smaller left inguinal hernia. He is thin I think he is a good candidate for laparoscopic repair. That is his preference. I compared open repair and laparoscopic repair as well as long-term results and risks. He will be scheduled for laparoscopic repair of bilateral inguinal hernias with mesh at [facility] long. Overnight stay. I discussed the indications, details, techniques, and numerous risks with him.  He agrees with this plan.  Operative Findings:       On the right side he had a large indirect hernia but no evidence of direct hernia.  On the right side I had to divide the inferior epigastric vessels following control with metal clips.  I did this to provide adequate exposure and placement of the mesh.  The indirect sac on the right side was very large and ultimately I had to open it up to inspect the interior and  was then able to pull it all the way down and then closed it with metal clips.  On the left side he had a moderate sized direct left inguinal hernia.  He did not have an indirect hernia on the left side and I could see the peritoneal edge and pulled it down medially and laterally out of the way.  Procedure in Detail:          Following the induction of general endotracheal anesthesia the patient's bladder was emptied with in and out catheterization because he was unable to void preop.  Clear urine was obtained.  Surgical timeout was performed.  Intravenous antibiotics were given.  The abdomen and genitalia were prepped and draped in a sterile fashion.      A transverse incision  was made just below the umbilicus.  The fascia was incised transversely.  The right rectus muscle was retracted laterally.  Spacemaker balloon was inserted into the right rectus sheath in the midline down to just above the symphysis pubis.  Video camera inserted.  The balloon was inflated under direct vision and deployed nicely.  The balloon was held in place for about 3 minutes.  The balloon was then deflated and removed.  The trocar was inserted into the fascia and the trocar balloon inflated and secured.  The Spacemaker balloon was then connected to the insufflator at 14 mmHg.  Insufflation was good.  Visualization was excellent.  A little bit of blood but no active bleeding.  We could see the symphysis pubis and both Cooper's ligaments.  A 5 mm trocar was placed in the midline just below the umbilicus.  I used this to clean off the anatomy on the right and the left.  Once I had pulled down the peritoneum and could see the muscle layers I placed a 5 mm trocar under direct vision on the right side and on the left side and both of these were above the level of the anterior superior iliac spine.      Under direct vision I injected a 50% solution of Exparel in the mid and anterior axillary line to block the ilioinguinal and iliohypogastric  nerves.  This went well.      I dissected the right side first.  I transected the inferior epigastric vessels between metal clips.  This provided exposure to the large indirect sac and cord structures.  I slowly dissected this out.  I entered the sac found no incarcerated contents.  I slowly dissected the entire indirect sac out of the inguinal canal and pulled it back superiorly.  The sac was then closed under direct vision with metal clips.  I checked for direct hernia and there was none.  On the left side he had a direct hernia but no incarcerated contents.  I skeletonized the cord structures on the left side.  I was able to pull the peritoneum down and see it well but it there was no indirect sac.         Addendum: I logged onto the PMP aware website and reviewed his prescription medication history    [name], M.D., FACS General and Minimally Invasive Surgery Breast and Colorectal Surgery  03/23/2019 9:25 AM

## 2019-03-23 NOTE — Transfer of Care (Signed)
Immediate Anesthesia Transfer of Care Note  Patient: Jesus Alvarez  Procedure(s) Performed: LAPAROSCOPIC BILATERAL INGUINAL HERNIA REPAIR WITH MESH, BILATERAL TAP BLOCK (Bilateral Ankle)  Patient Location: PACU  Anesthesia Type:General  Level of Consciousness: drowsy  Airway & Oxygen Therapy: Patient Spontanous Breathing and Patient connected to face mask oxygen  Post-op Assessment: Report given to RN and Post -op Vital signs reviewed and stable  Post vital signs: Reviewed and stable  Last Vitals:  Vitals Value Taken Time  BP 93/58 03/23/19 0932  Temp    Pulse 64 03/23/19 0935  Resp 13 03/23/19 0935  SpO2 100 % 03/23/19 0935  Vitals shown include unvalidated device data.  Last Pain:  Vitals:   03/23/19 0626  TempSrc:   PainSc: 3          Complications: No apparent anesthesia complications

## 2019-03-23 NOTE — Interval H&P Note (Signed)
History and Physical Interval Note:  03/23/2019 5:56 AM  Jesus Alvarez  has presented today for surgery, with the diagnosis of BILATERAL INGUINAL HERNIA.  The various methods of treatment have been discussed with the patient and family. After consideration of risks, benefits and other options for treatment, the patient has consented to  Procedure(s): LAPAROSCOPIC BILATERAL INGUINAL HERNIA REPAIR WITH MESH (Bilateral) as a surgical intervention.  The patient's history has been reviewed, patient examined, no change in status, stable for surgery.  I have reviewed the patient's chart and labs.  Questions were answered to the patient's satisfaction.     Adin Hector

## 2019-03-23 NOTE — Anesthesia Postprocedure Evaluation (Signed)
Anesthesia Post Note  Patient: Jayron Maqueda  Procedure(s) Performed: LAPAROSCOPIC BILATERAL INGUINAL HERNIA REPAIR WITH MESH, BILATERAL TAP BLOCK (Bilateral Ankle)     Patient location during evaluation: PACU Anesthesia Type: General Level of consciousness: awake and alert Pain management: pain level controlled Vital Signs Assessment: post-procedure vital signs reviewed and stable Respiratory status: spontaneous breathing, nonlabored ventilation, respiratory function stable and patient connected to nasal cannula oxygen Cardiovascular status: blood pressure returned to baseline and stable Postop Assessment: no apparent nausea or vomiting Anesthetic complications: no    Last Vitals:  Vitals:   03/23/19 1015 03/23/19 1030  BP: 92/60 95/60  Pulse: 60 64  Resp: 15 11  Temp: (!) 35.9 C   SpO2: 97% 100%    Last Pain:  Vitals:   03/23/19 1030  TempSrc:   PainSc: 0-No pain                 Elsi Stelzer S

## 2019-03-23 NOTE — Anesthesia Procedure Notes (Signed)
Procedure Name: Intubation Date/Time: 03/23/2019 7:41 AM Performed by: Gwyndolyn Saxon, CRNA Pre-anesthesia Checklist: Patient identified, Emergency Drugs available, Suction available and Patient being monitored Patient Re-evaluated:Patient Re-evaluated prior to induction Oxygen Delivery Method: Circle system utilized Preoxygenation: Pre-oxygenation with 100% oxygen Induction Type: IV induction Ventilation: Mask ventilation without difficulty Laryngoscope Size: Miller and 2 Grade View: Grade I Tube type: Oral Tube size: 7.5 mm Number of attempts: 1 Airway Equipment and Method: Patient positioned with wedge pillow and Stylet Placement Confirmation: ETT inserted through vocal cords under direct vision,  positive ETCO2 and breath sounds checked- equal and bilateral Secured at: 22 cm Tube secured with: Tape Dental Injury: Teeth and Oropharynx as per pre-operative assessment

## 2019-03-24 ENCOUNTER — Encounter (HOSPITAL_COMMUNITY): Payer: Self-pay | Admitting: General Surgery

## 2019-03-24 DIAGNOSIS — K4020 Bilateral inguinal hernia, without obstruction or gangrene, not specified as recurrent: Secondary | ICD-10-CM | POA: Diagnosis not present

## 2019-03-24 NOTE — Discharge Summary (Signed)
Patient ID: Jesus KingfisherJames Alvarez [id number] 78 y.o. 10-Apr-1941  Admit date: 03/23/2019  Discharge date and time: 03/24/2019  Admitting Physician: Ernestene MentionHaywood M Anaysha Andre  Discharge Physician: Ernestene MentionHaywood M Elwanda Moger  Admission Diagnoses: BILATERAL INGUINAL HERNIA  Discharge Diagnoses: Bilateral inguinal hernia                                         BPH                                         Chronic pain syndrome                                         Chronic narcotic use                                         Hypertension, essential                                         Degenerative disc disease, lumbar                                         Irritable bowel syndrome with constipation                                         CKD stage III                                           Operations: Procedure(s): LAPAROSCOPIC BILATERAL INGUINAL HERNIA REPAIR WITH MESH, BILATERAL TAP BLOCK  Admission Condition: good  Discharged Condition: good  Indication for Admission: This is a very pleasant 78 year old man who presents for elective repair of bilateral inguinal hernias.   Benedetto GoadFred Wilson is his PCP and he originally referred the patient in August 2019 his wife is a breast cancer patient of mine. He's noted a right groin bulge for almost 3 years now with occasional pain. Getting larger. Over the past 10 months they're slightly larger and slightly more painful but has never been incarcerated. Exam reveals a larger right and a smaller left inguinal hernia. He is thin I think he is a good candidate for laparoscopic repair. That is his preference. I compared open repair and laparoscopic repair as well as long-term results and risks. He will be scheduled for laparoscopic repair of bilateral inguinal hernias [facility] Overnight stay. I discussed the indications, details, techniques, and numerous risks with him.  He agrees with this plan.  Hospital Course: On the day of  admission the patient was taken to the operating room and underwent laparoscopic preperitoneal repair of bilateral inguinal hernias with mesh.  Bilateral nerve blocks were performed under laparoscopic control.  Technically the  surgery went well.  He was observed overnight and did well.  He had mild to moderate pain but was able to ambulate.  On postop day 1 he was ambulating in the room.  He had voided good amounts and was not having any problems with urinary retention.  He was not having any nausea or vomiting.  He wanted to go home once he had breakfast and walked in the hall.  Exam revealed his abdomen was soft.  All the trocar sites look good with slight bruising.  There was also slight bruising of the penis and scrotum but there was no edema seroma or hematoma anywhere.      He was given instructions in diet and activities.  No lifting.  Frequent ambulation encouraged.      He has chronic pain syndrome and is under chronic pain management with Percocet and tramadol at home and so no new prescriptions were written       I urged him to take Senokot twice a day because of the risk of constipation       He has an appointment to see me on August 13.  Consults: None  Significant Diagnostic Studies: Preop labs  Treatments: surgery: Laparoscopic repair bilateral inguinal hernias with mesh  Disposition: Home  Patient Instructions:  Allergies as of 03/24/2019      Reactions   Ketamine Other (See Comments)   2004 -LSD Trip   Nabumetone Other (See Comments)   Unknown   Naproxen Sodium Other (See Comments)   ulcers   Oxaprozin Other (See Comments)   Unknown   Rofecoxib Other (See Comments)   Unknown   Aspirin Rash   Azithromycin Rash   Erythromycin Base Rash   Nsaids Rash   Prednisone Rash      Medication List    TAKE these medications   bisoprolol 10 MG tablet Commonly known as: ZEBETA Take 10 mg by mouth daily.   Caltrate 600+D 600-400 MG-UNIT tablet Generic drug: Calcium  Carbonate-Vitamin D Take 1 tablet by mouth daily.   diazepam 10 MG tablet Commonly known as: VALIUM Take 0.5 tablets (5 mg total) by mouth 2 (two) times daily. What changed: when to take this   dicyclomine 20 MG tablet Commonly known as: BENTYL Take 20 mg by mouth 2 (two) times daily before a meal.   lidocaine 2 % solution Commonly known as: XYLOCAINE Use as directed 1 mL in the mouth or throat every 6 (six) hours as needed for mouth pain.   NexIUM 40 MG capsule Generic drug: esomeprazole Take 40 mg by mouth daily.   oxyCODONE-acetaminophen 10-325 MG tablet Commonly known as: PERCOCET Take 1 tablet by mouth 3 (three) times daily as needed for pain.   promethazine 25 MG tablet Commonly known as: PHENERGAN Take 25 mg by mouth at bedtime as needed (sleep).   tamsulosin 0.4 MG Caps capsule Commonly known as: FLOMAX Take 0.4 mg by mouth daily.   topiramate 25 MG tablet Commonly known as: TOPAMAX TAKE 3 TABLETS (75 MG TOTAL) BY MOUTH AT BEDTIME. What changed: how much to take   traMADol 50 MG tablet Commonly known as: ULTRAM Take 50-100 mg by mouth 4 (four) times daily as needed (pain).   triamcinolone 0.1 % paste Commonly known as: KENALOG Use as directed 0.1 g in the mouth or throat as needed (mouth sores).   UNABLE TO FIND Take 1 Dose by mouth daily. Med Name: Hemp oil. Takes for appetite. 10.6 mg per day   valACYclovir  1000 MG tablet Commonly known as: VALTREX Take 1 tablet (1,000 mg total) by mouth 3 (three) times daily. What changed:   when to take this  reasons to take this   vitamin B-12 1000 MCG tablet Commonly known as: CYANOCOBALAMIN Take 1,000 mcg by mouth daily.            Discharge Care Instructions  (From admission, onward)         Start     Ordered   03/24/19 0000  Discharge wound care:    Comments: The wounds do not require any specific care The clear plastic superglue will wear off in 2 or 3 weeks You may shower Expect some  bruising, which will be temporary   03/24/19 0623          Activity: No sports or heavy lifting for 5 weeks.  No driving for 10 to 12 days Diet: low fat, low cholesterol diet Wound Care: as directed  Follow-up:  With Dr. Dalbert Batman in 3 weeks.  He has an appointment to see me on August 13..  SignedEdsel Petrin. Dalbert Batman, M.D., FACS General and minimally invasive surgery Breast and Colorectal Surgery  03/24/2019, 6:24 AM

## 2019-04-29 ENCOUNTER — Telehealth: Payer: Self-pay | Admitting: Neurology

## 2019-04-29 ENCOUNTER — Other Ambulatory Visit: Payer: Self-pay

## 2019-04-29 ENCOUNTER — Ambulatory Visit (INDEPENDENT_AMBULATORY_CARE_PROVIDER_SITE_OTHER): Payer: Medicare Other | Admitting: Neurology

## 2019-04-29 ENCOUNTER — Encounter: Payer: Self-pay | Admitting: Neurology

## 2019-04-29 VITALS — BP 104/68 | HR 58 | Ht 71.0 in | Wt 126.0 lb

## 2019-04-29 DIAGNOSIS — G43009 Migraine without aura, not intractable, without status migrainosus: Secondary | ICD-10-CM | POA: Diagnosis not present

## 2019-04-29 MED ORDER — TOPIRAMATE 25 MG PO TABS: 25.0000 mg | ORAL_TABLET | Freq: Every day | ORAL | 1 refills | Status: DC

## 2019-04-29 MED ORDER — DIAZEPAM 5 MG PO TABS: 5.0000 mg | ORAL_TABLET | Freq: Every day | ORAL | 1 refills | Status: DC

## 2019-04-29 NOTE — Progress Notes (Signed)
Reason for visit: Migraine headache  Jesus Alvarez is an 78 y.o. male  History of present illness:  Jesus Alvarez is a 78 year old right-handed white male with a history of migraine headaches that have been doing quite well recently.  The patient takes 50 mg Topamax at night, he has only had 1 migraine since last seen, this occurred in the fall 2019.  He may take Excedrin Migraine when he does get a headache, weather changes may result in "sinus headaches" in the left frontal area that are not severe.  He will take a decongestant with some benefit.  He recently had inguinal hernia surgery bilaterally on 23 March 2019, he is recovering from this.  He is trying to cut back on his diazepam taking 5 mg at night.  He does have some discomfort in the left shoulder area associated with prior neck surgery.  The reduction in his diazepam dose has not altered his discomfort.  Past Medical History:  Diagnosis Date  . Arthritis    DEGENERATIVE  . BPH (benign prostatic hyperplasia) 03/23/2019  . Chronic kidney disease (CKD), stage III (moderate) (HCC) 03/23/2019  . Chronic pain syndrome   . CKD (chronic kidney disease) stage 3, GFR 30-59 ml/min (HCC)   . Essential hypertension, benign 03/23/2019  . Family history of adverse reaction to anesthesia    mother got headaches and naseau  . Hypertension   . IBS (irritable bowel syndrome)   . Increased heart rate    at times pulse gets fast per pt.  . Migraine without aura, without mention of intractable migraine without mention of status migrainosus 10/12/2013  . Osteoporosis   . Pneumonia    x2  . Sleep apnea    mild no mask needed     Past Surgical History:  Procedure Laterality Date  . CERVICAL FUSION     with titaanium plates limited movement  neck dosent go back or side to side   c3-c5  2005  . ELBOW FRACTURE SURGERY Right   . HEMORRHOID SURGERY    . INGUINAL HERNIA REPAIR Bilateral 03/23/2019   Procedure: LAPAROSCOPIC BILATERAL INGUINAL  HERNIA REPAIR WITH MESH, BILATERAL TAP BLOCK;  Surgeon: Claud Kelp, MD;  Location: [facility];  Service: General;  Laterality: Bilateral;  . LUMBOSACRAL SURGERY     laminectomy x3 l4, l5  . SINUS EXPLORATION     X3  . SPINE SURGERY      Family History  Problem Relation Age of Onset  . Arthritis Sister   . Parkinsonism Brother   . Arthritis Sister     Social history:  reports that he has quit smoking. He has never used smokeless tobacco. He reports that he does not drink alcohol or use drugs.    Allergies  Allergen Reactions  . Ketamine Other (See Comments)    2004 -LSD Trip  . Nabumetone Other (See Comments)    Unknown  . Naproxen Sodium Other (See Comments)    ulcers  . Oxaprozin Other (See Comments)    Unknown  . Rofecoxib Other (See Comments)    Unknown  . Aspirin Rash  . Azithromycin Rash  . Erythromycin Base Rash  . Nsaids Rash  . Prednisone Rash    Medications:  Prior to Admission medications   Medication Sig Start Date End Date Taking? Authorizing Provider  bisoprolol (ZEBETA) 10 MG tablet Take 10 mg by mouth daily. 09/05/13  Yes [provider]  Calcium Carbonate-Vitamin D (CALTRATE 600+D) 600-400 MG-UNIT per tablet Take  1 tablet by mouth daily.   Yes [provider]  diazepam (VALIUM) 10 MG tablet Take 0.5 tablets (5 mg total) by mouth 2 (two) times daily. Patient taking differently: Take 5 mg by mouth at bedtime.  11/11/18  Yes Kathrynn Ducking, MD  dicyclomine (BENTYL) 20 MG tablet Take 20 mg by mouth 2 (two) times daily before a meal.  09/15/13  Yes [provider]  lidocaine (XYLOCAINE) 2 % solution Use as directed 1 mL in the mouth or throat every 6 (six) hours as needed for mouth pain. 10/12/13  Yes Kathrynn Ducking, MD  NEXIUM 40 MG capsule Take 40 mg by mouth daily. 09/04/13  Yes [provider]  oxyCODONE-acetaminophen (PERCOCET) 10-325 MG per tablet Take 1 tablet by mouth 3 (three) times daily as needed for pain.   09/18/13  Yes [provider]  promethazine (PHENERGAN) 25 MG tablet Take 25 mg by mouth at bedtime as needed (sleep).  08/18/13  Yes [provider]  tamsulosin (FLOMAX) 0.4 MG CAPS capsule Take 0.4 mg by mouth daily.   Yes [provider]  topiramate (TOPAMAX) 25 MG tablet TAKE 3 TABLETS (75 MG TOTAL) BY MOUTH AT BEDTIME. Patient taking differently: Take 50 mg by mouth at bedtime.  10/20/18  Yes Kathrynn Ducking, MD  traMADol (ULTRAM) 50 MG tablet Take 50-100 mg by mouth 4 (four) times daily as needed (pain).    Yes [provider]  triamcinolone (KENALOG) 0.1 % paste Use as directed 0.1 g in the mouth or throat as needed (mouth sores).  09/15/13  Yes [provider]  UNABLE TO FIND Take 1 Dose by mouth daily. Med Name: Hemp oil. Takes for appetite. 10.6 mg per day   Yes [provider]  valACYclovir (VALTREX) 1000 MG tablet Take 1 tablet (1,000 mg total) by mouth 3 (three) times daily. Patient taking differently: Take 1,000 mg by mouth 3 (three) times daily as needed (fever blisters).  02/18/18  Yes Delo, Nathaneil Canary, MD  vitamin B-12 (CYANOCOBALAMIN) 1000 MCG tablet Take 1,000 mcg by mouth daily.   Yes [provider]    ROS:  Out of a complete 14 system review of symptoms, the patient complains only of the following symptoms, and all other reviewed systems are negative.  Neck pain Headache  Blood pressure 104/68, pulse (!) 58, height 5\' 11"  (1.803 m), weight 126 lb (57.2 kg).  Physical Exam  General: The patient is alert and cooperative at the time of the examination.  Skin: No significant peripheral edema is noted.   Neurologic Exam  Mental status: The patient is alert and oriented x 3 at the time of the examination. The patient has apparent normal recent and remote memory, with an apparently normal attention span and concentration ability.   Cranial nerves: Facial symmetry is present. Speech is normal, no aphasia or  dysarthria is noted. Extraocular movements are full. Visual fields are full.  Motor: The patient has good strength in all 4 extremities.  Sensory examination: Soft touch sensation is symmetric on the face, arms, and legs.  Coordination: The patient has good finger-nose-finger and heel-to-shin bilaterally.  Gait and station: The patient has a normal gait. Tandem gait is normal. Romberg is negative. No drift is seen.  Reflexes: Deep tendon reflexes are symmetric.   Assessment/Plan:  1.  Migraine headache, well controlled  2.  Cervical spondylosis, chronic neck pain  The patient will reduce the Topamax to 25 mg at night for 3 months  and if he does well with his headaches, he will stop the drug.  He will be given a prescription for the 5 mg diazepam at night, he may try staying on this for another month and then stop the medication to see if he can do well without it.  He will follow-up otherwise in 1 year.  [name]. Keith  MD 04/29/2019 11:25 AM  [facility] [address] [place], [place] [id number]  Phone [phone] Fax [phone]

## 2019-04-29 NOTE — Telephone Encounter (Signed)
This patient did not show for a revisit appointment today. 

## 2019-04-29 NOTE — Patient Instructions (Signed)
Reduce the Topamax to 25 mg at night for 3 months the stop the medication if you are doing well.

## 2019-06-14 ENCOUNTER — Other Ambulatory Visit: Payer: Self-pay | Admitting: Neurology

## 2020-03-09 ENCOUNTER — Other Ambulatory Visit: Payer: Self-pay | Admitting: Neurology

## 2021-04-06 ENCOUNTER — Other Ambulatory Visit: Payer: Self-pay | Admitting: Family Medicine

## 2021-04-06 DIAGNOSIS — M8580 Other specified disorders of bone density and structure, unspecified site: Secondary | ICD-10-CM

## 2021-09-28 ENCOUNTER — Ambulatory Visit
Admission: RE | Admit: 2021-09-28 | Discharge: 2021-09-28 | Disposition: A | Discharge status: Home / Self Care | Payer: Medicare Other | Source: Ambulatory Visit | Attending: Family Medicine | Admitting: Family Medicine

## 2021-09-28 DIAGNOSIS — M8580 Other specified disorders of bone density and structure, unspecified site: Secondary | ICD-10-CM

## 2022-11-01 ENCOUNTER — Ambulatory Visit: Payer: Medicare Other | Admitting: Cardiology

## 2022-11-01 ENCOUNTER — Other Ambulatory Visit: Payer: Medicare Other

## 2022-11-01 ENCOUNTER — Encounter: Payer: Self-pay | Admitting: Cardiology

## 2022-11-01 VITALS — BP 107/67 | HR 57 | Resp 16 | Ht 71.0 in | Wt 137.0 lb

## 2022-11-01 DIAGNOSIS — I440 Atrioventricular block, first degree: Secondary | ICD-10-CM

## 2022-11-01 DIAGNOSIS — G4733 Obstructive sleep apnea (adult) (pediatric): Secondary | ICD-10-CM

## 2022-11-01 DIAGNOSIS — I10 Essential (primary) hypertension: Secondary | ICD-10-CM

## 2022-11-01 DIAGNOSIS — I480 Paroxysmal atrial fibrillation: Secondary | ICD-10-CM

## 2022-11-01 DIAGNOSIS — E782 Mixed hyperlipidemia: Secondary | ICD-10-CM

## 2022-11-01 MED ORDER — DRONEDARONE HCL 400 MG PO TABS: 400.0000 mg | ORAL_TABLET | Freq: Two times a day (BID) | ORAL | 0 refills | Status: DC

## 2022-11-01 NOTE — Progress Notes (Signed)
Primary Physician/Referring:  Christain Sacramento, MD  Patient ID: Jesus Alvarez, male    DOB: 26-May-1941, 82 y.o.   MRN: [id number]  Chief Complaint  Patient presents with   Atrial Fibrillation   New Patient (Initial Visit)   HPI:    Jesus Alvarez  is a 82 y.o. 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  Over the past 3 years, patient has been noticing episodes of palpitations and eventually brought a Kardia machine and has been monitoring his EKG.  He is to have 2-3 episodes of atrial fibrillation in a year, however in 2024 in the past 2 months, he has already had 3 episodes and last episode on 10/23/2022 lasted about 3 to 4 hours.  Usually episodes last anywhere from 15 to 20 minutes.  He wanted to be further evaluated.    Except for feeling of palpitations, he has no other symptoms with atrial fibrillation.  Denies chest pain, dizziness or syncope.  Past Medical History:  Diagnosis Date   Arthritis    DEGENERATIVE   BPH (benign prostatic hyperplasia) 03/23/2019   Chronic kidney disease (CKD), stage III (moderate) (HCC) 03/23/2019   Chronic pain syndrome    CKD (chronic kidney disease) stage 3, GFR 30-59 ml/min (HCC)    Essential hypertension, benign 03/23/2019   Family history of adverse reaction to anesthesia    mother got headaches and naseau   Hypertension    IBS (irritable bowel syndrome)    Increased heart rate    at times pulse gets fast per pt.   Migraine without aura, without mention of intractable migraine without mention of status migrainosus 10/12/2013   Osteoporosis    Pneumonia    x2   Sleep apnea    mild no mask needed    Past Surgical History:  Procedure Laterality Date   CERVICAL FUSION     with titaanium plates limited movement  neck dosent go back or side to  side   c3-c5  2005   ELBOW FRACTURE SURGERY Right    HEMORRHOID SURGERY     INGUINAL HERNIA REPAIR Bilateral 03/23/2019   Procedure: LAPAROSCOPIC BILATERAL INGUINAL HERNIA REPAIR WITH MESH, BILATERAL TAP BLOCK;  Surgeon: Fanny Skates, MD;  Location: [facility];  Service: General;  Laterality: Bilateral;   LUMBOSACRAL SURGERY     laminectomy x3 l4, l5   MAXILLARY SINUS LIFT     SINUS EXPLORATION     X3   SPINE SURGERY     Family History  Problem Relation Age of Onset   Arthritis Sister    Arthritis Sister    Parkinsonism Brother     Social History   Tobacco Use   Smoking status: Former    Packs/day: 0.25    Years: 6.00    Total pack years: 1.50    Types: Cigarettes    Quit date: 1968    Years since quitting: 56.2   Smokeless tobacco: Never  Substance Use Topics   Alcohol use: No   Marital Status: Married  ROS  Review of Systems  Cardiovascular:  Positive for palpitations. Negative for chest pain, dyspnea on exertion and leg swelling.   Objective      11/01/2022    1:21 PM 04/29/2019   11:19 AM 03/24/2019    5:54 AM  Vitals with BMI  Height '5\' 11"'$  '5\' 11"'$    Weight 137 lbs 126 lbs   BMI [id number]   Systolic [id number]  Diastolic 67 68 66  Pulse 57 58 63   SpO2: 98 %  Physical Exam Neck:     Vascular: No carotid bruit or JVD.  Cardiovascular:     Rate and Rhythm: Normal rate and regular rhythm.     Pulses: Intact distal pulses.     Heart sounds: Normal heart sounds. No murmur heard.    No gallop.  Pulmonary:     Effort: Pulmonary effort is normal.     Breath sounds: Normal breath sounds.  Abdominal:     General: Bowel sounds are normal.     Palpations: Abdomen is soft.  Musculoskeletal:     Right lower leg: No edema.     Left lower leg: No edema.     Medications and allergies   Allergies  Allergen Reactions   Ketamine Other (See Comments)    2004 -LSD Trip   Nabumetone Other (See Comments)    Unknown   Naproxen Sodium Other (See Comments)     ulcers   Oxaprozin Other (See Comments)    Unknown   Rofecoxib Other (See Comments)    Unknown   Aspirin Rash   Azithromycin Rash   Erythromycin Base Rash   Nsaids Rash   Prednisone Rash     Medication list   Current Outpatient Medications:    bisoprolol (ZEBETA) 10 MG tablet, Take 10 mg by mouth daily., Disp: , Rfl:    dronedarone (MULTAQ) 400 MG tablet, Take 1 tablet (400 mg total) by mouth 2 (two) times daily with a meal., Disp: 180 tablet, Rfl: 0   Ergocalciferol (VITAMIN D2) 10 MCG (400 UNIT) TABS, Take by mouth., Disp: , Rfl:    lidocaine (XYLOCAINE) 2 % solution, Use as directed 1 mL in the mouth or throat every 6 (six) hours as needed for mouth pain., Disp: 100 mL, Rfl: 3   mirtazapine (REMERON) 30 MG tablet, Take 30 mg by mouth at bedtime., Disp: , Rfl:    NEXIUM 40 MG capsule, Take 40 mg by mouth daily., Disp: , Rfl:    oxyCODONE-acetaminophen (PERCOCET) 10-325 MG per tablet, Take 1 tablet by mouth 3 (three) times daily as needed for pain. , Disp: , Rfl:    promethazine (PHENERGAN) 25 MG tablet, Take 25 mg by mouth at bedtime as needed (sleep). , Disp: , Rfl:    RESTASIS 0.05 % ophthalmic emulsion, Place 1 drop into both eyes 2 (two) times daily., Disp: , Rfl:    tamsulosin (FLOMAX) 0.4 MG CAPS capsule, Take 0.4 mg by mouth daily., Disp: , Rfl:    traMADol (ULTRAM) 50 MG tablet, Take 50-100 mg by mouth 4 (four) times daily., Disp: , Rfl:    triamcinolone (KENALOG) 0.1 % paste, Use as directed 0.1 g in the mouth or throat as needed (mouth sores). , Disp: , Rfl:    valACYclovir (VALTREX) 1000 MG tablet, Take 1 tablet (1,000 mg total) by mouth 3 (three) times daily. (Patient taking differently: Take 1,000 mg by mouth 3 (three) times daily as needed (fever blisters).), Disp: 21 tablet, Rfl: 0   vitamin B-12 (CYANOCOBALAMIN) 1000 MCG tablet, Take 1,000 mcg by mouth daily., Disp: , Rfl:  Laboratory examination:   Lab Results  Component Value Date   NA 135 03/18/2019   K 4.2  03/18/2019   CO2 26 03/18/2019   GLUCOSE 79 03/18/2019  BUN 29 (H) 03/18/2019   CREATININE 1.60 (H) 03/18/2019   CALCIUM 8.8 (L) 03/18/2019   GFRNONAA 41 (L) 03/18/2019    External labs:   Labs 10/16/2022:  Hb 12.9/HCT 37.2, platelets 09/11/1988, normal indicis.  Total cholesterol 208, triglycerides 187, HDL 44, LDL 146.  Non-HDL cholesterol 164.  Serum glucose 75 mg, BUN 28, creatinine 1.79, EGFR 38 mL, potassium 4.6, sodium 135.  LFTs normal.  TSH 4.02, normal.  Radiology:    Cardiac Studies:   Kardia EKG monitoring November 21, 2022:    EKG:   EKG 200 2024: Sinus bradycardia with first-degree block at the rate of 56 bpm, normal axis, incomplete right bundle branch block.  No evidence of ischemia.    Assessment     ICD-10-CM   1. Paroxysmal atrial fibrillation (HCC)  I48.0 Ambulatory referral to Sleep Studies    LONG TERM MONITOR (3-14 DAYS)    [facility] ECHOCARDIOGRAM COMPLETE    dronedarone (MULTAQ) 400 MG tablet    2. OSA (obstructive sleep apnea)  G47.33 Ambulatory referral to Sleep Studies    3. Primary hypertension  I10     4. 1st degree AV block  I44.0 EKG 12-Lead    5. Mixed hyperlipidemia  E78.2       CHA2DS2-VASc Score is 3.  Yearly risk of stroke: 3.2% (A, HTN).  Score of 1=0.6; 2=2.2; 3=3.2; 4=4.8; 5=7.2; 6=9.8; 7=>9.8) -(CHF; HTN; vasc disease DM,  Male = 1; Age <65 =0; 65-74 = 1,  >75 =2; stroke/embolism= 2).    Orders Placed This Encounter  Procedures   Ambulatory referral to Sleep Studies    Referral Priority:   Routine    Referral Type:   Consultation    Referral Reason:   Specialty Services Required    Number of Visits Requested:   1   LONG TERM MONITOR (3-14 DAYS)    Standing Status:   Future    Standing Expiration Date:   11/01/2023    Order Specific Question:   Where should this test be performed?    Answer:   [facility]    Order Specific Question:   Does the patient have an implanted cardiac device?    Answer:   No    Order Specific  Question:   Prescribed days of wear    Answer:   67    Order Specific Question:   Type of enrollment    Answer:   Clinic Enrollment    Order Specific Question:   Release to patient    Answer:   Immediate   EKG 12-Lead   [facility] ECHOCARDIOGRAM COMPLETE    Standing Status:   Future    Standing Expiration Date:   11/01/2023    Meds ordered this encounter  Medications   dronedarone (MULTAQ) 400 MG tablet    Sig: Take 1 tablet (400 mg total) by mouth 2 (two) times daily with a meal.    Dispense:  180 tablet    Refill:  0    Medications Discontinued During This Encounter  Medication Reason   Calcium Carbonate-Vitamin D (CALTRATE 600+D) 600-400 MG-UNIT per tablet Patient Preference   diazepam (VALIUM) 5 MG tablet Patient Preference   dicyclomine (BENTYL) 20 MG tablet Patient Preference   topiramate (TOPAMAX) 25 MG tablet Patient Preference   UNABLE TO FIND Patient Preference     Recommendations:   Boe Goffredo is a 82 y.o. 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  1. Paroxysmal atrial fibrillation (HCC) Patient with paroxysmal atrial fibrillation that started 3 years ago, usually lasting few minutes, latest episode 10/31/2022 lasting for about 3 to 4 hours.  Usually he used to have 1-2 episodes or 3 episodes in a year but this year in the past 2 months he has had 3 episodes already.  In view of this, I will go ahead and start him on Multaq 400 mg p.o. twice daily.  Before starting the medication, he will wear Zio patch for 2 weeks and first 5 days he will not be on Multaq.   Would like to evaluate atrial fibrillation burden.  His CHA2DS2-VASc risk score is 3.  However he has not had atrial fibrillation for >24 hours.  He has symptomatic episodes since we will hold off on  anticoagulation.  2. OSA (obstructive sleep apnea) Patient was diagnosed with OSA many years ago.  He was given a oral prosthesis and he used it for few months.  He has never tried CPAP.  I will refer him for sleep evaluation.  3. Primary hypertension Blood pressure under excellent control on bisoprolol.  He has underlying first-degree AV block due to low-dose beta-blocker use, however remains asymptomatic and continue the same.  4. 1st degree AV block As dictated above remains asymptomatic.  5. Mixed hyperlipidemia Patient does have mixed hyperlipidemia, I reviewed the labs with the patient, he is definitely not interested in starting any statin therapy.  I also opted for starting him on a low-dose dose of a statin and not necessarily monitoring the lipids, patient like to think about this.  I will see him back in 6 weeks and make further recommendations.   Adrian Prows, MD, [facility] 11/01/2022, 1:57 PM Office: [phone]

## 2022-12-04 NOTE — Progress Notes (Signed)
Zio Patch Extended out patient EKG monitoring 14 days starting 11/01/2022:  Predominant Rhythm : Sinus rhythm with first-degree AV block min HR: 47 bpm at 2:45 PM. Max HR 80 bpm at 2:53 AM with average heart rate of 57 bpm  Atrial arrhythmias: There were 7 atrial tachycardia episodes, longest 7 beats.  Occasional PACs and atrial couplets and rare atrial triplets were present.  Atrial fibrillation: None  Ventricular arrhythmias: Rare PVCs. PVC Burden <1%  Heart Block: None  Symptoms: 3 triggered events revealing sinus rhythm at rate of 53 to 64 bpm.   Discuss on OV soon

## 2022-12-05 ENCOUNTER — Ambulatory Visit: Payer: Medicare Other

## 2022-12-05 DIAGNOSIS — I480 Paroxysmal atrial fibrillation: Secondary | ICD-10-CM

## 2022-12-07 NOTE — Progress Notes (Signed)
Echocardiogram 12/05/2022:  Normal LV systolic function with visual EF 55-60%. Left ventricle cavity is normal in size. Normal left ventricular wall thickness. Normal global wall motion. Indeterminate diastolic filling pattern. Calculated EF 57%. Structurally normal trileaflet aortic valve.  Moderate (Grade II) aortic regurgitation. Structurally normal mitral valve.  Moderate (Grade II) mitral regurgitation. Structurally normal tricuspid valve.  Mild tricuspid regurgitation. No evidence of pulmonary hypertension. RVSP measures 30 mmHg. The aortic root is mildly dilated SOV 4.1 cm and STJ 3.8 cm. Mildly dilated ascending aorta at 4.1 cm. No prior available for comparison.  Discuss on OV soon

## 2022-12-13 ENCOUNTER — Ambulatory Visit: Payer: Medicare Other | Admitting: Cardiology

## 2022-12-13 ENCOUNTER — Encounter: Payer: Self-pay | Admitting: Cardiology

## 2022-12-13 VITALS — BP 121/72 | HR 57 | Resp 16 | Ht 71.0 in | Wt 137.0 lb

## 2022-12-13 DIAGNOSIS — G4733 Obstructive sleep apnea (adult) (pediatric): Secondary | ICD-10-CM

## 2022-12-13 DIAGNOSIS — R9431 Abnormal electrocardiogram [ECG] [EKG]: Secondary | ICD-10-CM

## 2022-12-13 DIAGNOSIS — I480 Paroxysmal atrial fibrillation: Secondary | ICD-10-CM

## 2022-12-13 DIAGNOSIS — I440 Atrioventricular block, first degree: Secondary | ICD-10-CM

## 2022-12-13 DIAGNOSIS — I351 Nonrheumatic aortic (valve) insufficiency: Secondary | ICD-10-CM

## 2022-12-13 NOTE — Progress Notes (Signed)
Primary Physician/Referring:  Barbie Banner, MD  Patient ID: Jesus Alvarez, male    DOB: December 16, 1940, 82 y.o.   MRN: [id number]  Chief Complaint  Patient presents with   Atrial Fibrillation   Follow-up    6 week   HPI:    Jesus Alvarez  is a 82 y.o. 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 Patient with paroxysmal atrial fibrillation that started 3 years ago, usually lasting few minutes, latest episode 10/31/2022 lasting for about 3 to 4 hours.  Usually he used to have 1-2 episodes or 3 episodes in a year but this year in the past 2 months he has had 3 episodes already.  In view of this at perform Zio patch monitoring which revealed 0% atrial fibrillation burden.  I had also advised him to start Multaq however patient has not started the medication.   Except for feeling of palpitations, he has no other symptoms with atrial fibrillation.  Patient states that since last office visit he has not had any palpitations, denies chest pain, dizziness or syncope.  Past Medical History:  Diagnosis Date   Arthritis    DEGENERATIVE   BPH (benign prostatic hyperplasia) 03/23/2019   Chronic kidney disease (CKD), stage III (moderate) 03/23/2019   Chronic pain syndrome    CKD (chronic kidney disease) stage 3, GFR 30-59 ml/min    Essential hypertension, benign 03/23/2019   Family history of adverse reaction to anesthesia    mother got headaches and [place]   Hypertension    IBS (irritable bowel syndrome)    Increased heart rate    at times pulse gets fast per pt.   Migraine without aura, without mention of intractable migraine without mention of status migrainosus 10/12/2013   Osteoporosis    Pneumonia    x2   Sleep apnea    mild no mask needed    Past Surgical History:  Procedure Laterality  Date   CERVICAL FUSION     with titaanium plates limited movement  neck dosent go back or side to side   c3-c5  2005   ELBOW FRACTURE SURGERY Right    HEMORRHOID SURGERY     INGUINAL HERNIA REPAIR Bilateral 03/23/2019   Procedure: LAPAROSCOPIC BILATERAL INGUINAL HERNIA REPAIR WITH MESH, BILATERAL TAP BLOCK;  Surgeon: Claud Kelp, MD;  Location: [facility];  Service: General;  Laterality: Bilateral;   LUMBOSACRAL SURGERY     laminectomy x3 l4, l5   MAXILLARY SINUS LIFT     SINUS EXPLORATION     X3   SPINE SURGERY     Family History  Problem Relation Age of Onset   Arthritis Sister    Arthritis Sister    Parkinsonism Brother     Social History   Tobacco Use   Smoking status: Former    Packs/day: 0.25    Years: 6.00    Additional pack years: 0.00    Total pack years: 1.50    Types: Cigarettes    Quit date: 1968    Years since quitting: 56.3   Smokeless tobacco: Never  Substance Use Topics   Alcohol use: No   Marital Status: Married  ROS  Review of Systems  Cardiovascular:  Negative for chest pain, dyspnea on exertion, leg swelling and palpitations.   Objective      12/13/2022  1:18 PM 11/01/2022    1:21 PM 04/29/2019   11:19 AM  Vitals with BMI  Height 5\' 11"  5\' 11"  5\' 11"   Weight 137 lbs 137 lbs 126 lbs  BMI 19.12 19.12 17.58  Systolic 121 107 782  Diastolic 72 67 68  Pulse 57 57 58   SpO2: 97 %  Physical Exam Neck:     Vascular: No carotid bruit or JVD.  Cardiovascular:     Rate and Rhythm: Normal rate and regular rhythm.     Pulses: Intact distal pulses.     Heart sounds: Murmur heard.     Blowing early diastolic murmur is present with a grade of 2/4 at the upper right sternal border and upper left sternal border.     No gallop.  Pulmonary:     Effort: Pulmonary effort is normal.     Breath sounds: Normal breath sounds.  Abdominal:     General: Bowel sounds are normal.     Palpations: Abdomen is soft.  Musculoskeletal:     Right lower leg: No  edema.     Left lower leg: No edema.    Medications and allergies   Allergies  Allergen Reactions   Ketamine Other (See Comments)    2004 -LSD Trip   Nabumetone Other (See Comments)    Unknown   Naproxen Sodium Other (See Comments)    ulcers   Oxaprozin Other (See Comments)    Unknown   Rofecoxib Other (See Comments)    Unknown   Aspirin Rash   Azithromycin Rash   Erythromycin Base Rash   Nsaids Rash   Prednisone Rash     Medication list   Current Outpatient Medications:    bisoprolol (ZEBETA) 10 MG tablet, Take 10 mg by mouth daily., Disp: , Rfl:    Ergocalciferol (VITAMIN D2) 10 MCG (400 UNIT) TABS, Take by mouth., Disp: , Rfl:    lidocaine (XYLOCAINE) 2 % solution, Use as directed 1 mL in the mouth or throat every 6 (six) hours as needed for mouth pain., Disp: 100 mL, Rfl: 3   NEXIUM 40 MG capsule, Take 40 mg by mouth daily., Disp: , Rfl:    oxyCODONE-acetaminophen (PERCOCET) 10-325 MG per tablet, Take 1 tablet by mouth 3 (three) times daily as needed for pain. , Disp: , Rfl:    promethazine (PHENERGAN) 25 MG tablet, Take 25 mg by mouth at bedtime as needed (sleep). , Disp: , Rfl:    RESTASIS 0.05 % ophthalmic emulsion, Place 1 drop into both eyes 2 (two) times daily., Disp: , Rfl:    tamsulosin (FLOMAX) 0.4 MG CAPS capsule, Take 0.4 mg by mouth daily., Disp: , Rfl:    traMADol (ULTRAM) 50 MG tablet, Take 50-100 mg by mouth 4 (four) times daily., Disp: , Rfl:    valACYclovir (VALTREX) 1000 MG tablet, Take 1 tablet (1,000 mg total) by mouth 3 (three) times daily. (Patient taking differently: Take 1,000 mg by mouth 3 (three) times daily as needed (fever blisters).), Disp: 21 tablet, Rfl: 0   vitamin B-12 (CYANOCOBALAMIN) 1000 MCG tablet, Take 1,000 mcg by mouth daily., Disp: , Rfl:    mirtazapine (REMERON) 30 MG tablet, Take 30 mg by mouth at bedtime. (Patient not taking: Reported on 12/13/2022), Disp: , Rfl:  Laboratory examination:   Lab Results  Component Value Date    NA 135 03/18/2019   K 4.2 03/18/2019   CO2 26 03/18/2019   GLUCOSE 79 03/18/2019   BUN 29 (H) 03/18/2019  CREATININE 1.60 (H) 03/18/2019   CALCIUM 8.8 (L) 03/18/2019   GFRNONAA 41 (L) 03/18/2019    External labs:   Labs 10/16/2022:  Hb 12.9/HCT 37.2, platelets 09/11/1988, normal indicis.  Total cholesterol 208, triglycerides 187, HDL 44, LDL 146.  Non-HDL cholesterol 164.  Serum glucose 75 mg, BUN 28, creatinine 1.79, EGFR 38 mL, potassium 4.6, sodium 135.  LFTs normal.  TSH 4.02, normal.  Radiology:    Cardiac Studies:   Kardia EKG monitoring 10/23/2022:    Zio Patch Extended out patient EKG monitoring 14 days starting 11/01/2022:  Predominant Rhythm : Sinus rhythm with first-degree AV block min HR: 47 bpm at 2:45 PM. Max HR 80 bpm at 2:53 AM with average heart rate of 57 bpm  Atrial arrhythmias: There were 7 atrial tachycardia episodes, longest 7 beats.  Occasional PACs and atrial couplets and rare atrial triplets were present.  Atrial fibrillation: None  Ventricular arrhythmias: Rare PVCs. PVC Burden <1%  Heart Block: None  Symptoms: 3 triggered events revealing sinus rhythm at rate of 53 to 64 bpm.   PCV ECHOCARDIOGRAM COMPLETE 12/05/2022  Narrative Echocardiogram 12/05/2022: Normal LV systolic function with visual EF 55-60%. Left ventricle cavity is normal in size. Normal left ventricular wall thickness. Normal global wall motion. Indeterminate diastolic filling pattern. Calculated EF 57%. Structurally normal trileaflet aortic valve.  Moderate (Grade II) aortic regurgitation. Structurally normal mitral valve.  Moderate (Grade II) mitral regurgitation. Structurally normal tricuspid valve.  Mild tricuspid regurgitation. No evidence of pulmonary hypertension. RVSP measures 30 mmHg. The aortic root is mildly dilated SOV 4.1 cm and STJ 3.8 cm. Mildly dilated ascending aorta at 4.1 cm. No prior available for comparison.      EKG:   EKG 12/13/2022: Sinus rhythm with  first-degree AV block at rate of 54 bpm, otherwise normal EKG.  Compared to 11/01/2022, no significant change.  Previously heart rate was 56 bpm.  Assessment     ICD-10-CM   1. Paroxysmal atrial fibrillation  I48.0 EKG 12-Lead    PCV MYOCARDIAL PERFUSION WITH LEXISCAN    2. Abnormal electrocardiogram  R94.31 PCV MYOCARDIAL PERFUSION WITH LEXISCAN    3. 1st degree AV block  I44.0     4. Moderate aortic regurgitation  I35.1     5. OSA (obstructive sleep apnea)  G47.33       CHA2DS2-VASc Score is 3.  Yearly risk of stroke: 3.2% (A, HTN).  Score of 1=0.6; 2=2.2; 3=3.2; 4=4.8; 5=7.2; 6=9.8; 7=>9.8) -(CHF; HTN; vasc disease DM,  Male = 1; Age <65 =0; 65-74 = 1,  >75 =2; stroke/embolism= 2).    Orders Placed This Encounter  Procedures   PCV MYOCARDIAL PERFUSION WITH LEXISCAN    Standing Status:   Future    Standing Expiration Date:   02/12/2023   EKG 12-Lead   No orders of the defined types were placed in this encounter.  Medications Discontinued During This Encounter  Medication Reason   triamcinolone (KENALOG) 0.1 % paste    dronedarone (MULTAQ) 400 MG tablet Patient Preference     Recommendations:   Jesus KingfisherJames Friesz is a 82 y.o. 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  1. Paroxysmal atrial fibrillation Patient with paroxysmal atrial fibrillation that started 3 years ago, usually lasting few minutes, latest episode 10/31/2022 lasting for about 3 to 4 hours.  Usually he used to have 1-2 episodes or 3 episodes in a year but this year in the past 2 months he has had 3 episodes already.  In view of this at perform Zio patch monitoring which revealed 0% atrial fibrillation burden.  I had also advised him to start Multaq however patient has not started the medication.  At this point in  view of low atrial fibrillation burden, I have recommended no anticoagulation for now, no antiarrhythmic therapy for now, watchful observation is indicated as patient is asymptomatic and will continue to monitor this at home.  I have discussed with him and his wife extensively regarding both risks and benefits of anticoagulation.  They prefer not to be on anticoagulation.  - EKG 12-Lead - PCV MYOCARDIAL PERFUSION WITH LEXISCAN; Future  2. Abnormal electrocardiogram In view of his age, atrial fibrillation, hyper lipidemia untreated, abnormal EKG, will proceed with stress testing, however in view of patient's back pain unable to exercise hence have to do Lexiscan nuclear stress test. - PCV MYOCARDIAL PERFUSION WITH LEXISCAN; Future  3. 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  4. Moderate aortic regurgitation I reviewed the results of the echocardiogram, he does have moderate aortic regurgitation and moderate mitral regurgitation, will continue to monitor this for now and may need or consider repeating his echocardiogram in a year.  5. OSA (obstructive sleep apnea) Patient has obstructive sleep apnea but has not been using CPAP but is using dental processes.  He has not been scheduled for sleep study, I suspect if his sleep apnea is well treated he may have less incidence of atrial fibrillation.  I will see him back in 3 months or sooner if problems.    Yates Decamp, MD, [facility] 12/13/2022, 2:39 PM Office: [phone]

## 2022-12-31 ENCOUNTER — Ambulatory Visit: Payer: Medicare Other

## 2022-12-31 DIAGNOSIS — R9431 Abnormal electrocardiogram [ECG] [EKG]: Secondary | ICD-10-CM

## 2022-12-31 DIAGNOSIS — I480 Paroxysmal atrial fibrillation: Secondary | ICD-10-CM

## 2023-01-04 NOTE — Progress Notes (Signed)
Please let the patient know that the stress test does not show any high risk features. He should make an appointment to follow up with his sleep doctor regarding sleep apnea

## 2023-01-08 NOTE — Progress Notes (Signed)
Called and spoke with patient regarding his stress test results. Also, patient stated that he has already scheduled an appointment with sleep doctor. He will be seeing them next week.

## 2023-01-24 ENCOUNTER — Encounter: Payer: Self-pay | Admitting: Neurology

## 2023-01-24 ENCOUNTER — Ambulatory Visit (INDEPENDENT_AMBULATORY_CARE_PROVIDER_SITE_OTHER): Payer: Medicare Other | Admitting: Neurology

## 2023-01-24 VITALS — BP 119/65 | HR 57 | Ht 67.0 in | Wt 131.6 lb

## 2023-01-24 DIAGNOSIS — G4733 Obstructive sleep apnea (adult) (pediatric): Secondary | ICD-10-CM

## 2023-01-24 DIAGNOSIS — Z9189 Other specified personal risk factors, not elsewhere classified: Secondary | ICD-10-CM

## 2023-01-24 DIAGNOSIS — R351 Nocturia: Secondary | ICD-10-CM

## 2023-01-24 DIAGNOSIS — Z79891 Long term (current) use of opiate analgesic: Secondary | ICD-10-CM

## 2023-01-24 DIAGNOSIS — I480 Paroxysmal atrial fibrillation: Secondary | ICD-10-CM

## 2023-01-24 DIAGNOSIS — G479 Sleep disorder, unspecified: Secondary | ICD-10-CM

## 2023-01-24 NOTE — Patient Instructions (Signed)

## 2023-01-24 NOTE — Progress Notes (Signed)
Subjective:    Patient ID: Jesus Alvarez is a 82 y.o. male.  HPI    Jesus Foley, MD, PhD Jesus Alvarez [address], [address] [address] [place], [place] [address]  Dear Jesus Alvarez,  I saw your patient, Jesus Alvarez, upon your kind request in my sleep clinic today for initial consultation of his sleep disorder, in particular, concern for underlying obstructive sleep apnea.  The patient is unaccompanied today. As you know, Jesus Alvarez is an 82 year old male with an underlying medical history of paroxysmal A-fib, first-degree AV block, hyperlipidemia, BPH, chronic kidney disease, irritable bowel syndrome, hypertension, migraine headaches, chronic pain (on narcotic pain medication), status post multiple surgeries, osteoporosis, history of pneumonia, and arthritis, who reports snoring and significant sleep disruption, difficulty initiating and maintaining sleep, as well as a prior diagnosis of sleep apnea. His Epworth sleepiness score is 8 out of 24, fatigue severity score is 30 out of 63.  I reviewed your office note from 11/01/2022.  He reports that he had a sleep study in or around 2004, under Dr. Jetty Alvarez and was diagnosed with sleep apnea at the time.  He opted to use an oral appliance but it was uncomfortable and he ended up chewing through it.  He did not have PAP therapy at the time.  He would be willing to consider PAP therapy.  He has had intermittent A-fib episodes.  He is currently not on a blood thinner.  He has significant sleep difficulty and does not have a set schedule, he sleeps on his sleep number bed but sometimes he has to sleep more upright and sometimes he has to change the firmness of his mattress depending on his pain.  He has chronic pain and is on narcotic pain medication, currently on Percocet twice daily and tramadol 6 times a day per PCP.  He has seen pain management in the past and was on higher doses of Percocet even.  He has a history of  significant bony injuries and multiple surgeries including neck surgery, lumbar spine surgery, facial reconstructive surgery.  He may go to bed around 3 AM.  He does not have a set rise time, he does have nocturia on an average night, at least once.  He lives with his wife Jesus Alvarez of 52 years.  They have 1 daughter age 53, they lost a daughter at a young age from leukemia.  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  He drinks caffeine in the form of coffee, usually 1 cup in the morning.  He does smoke and drink alcohol in college but has not had any in years.  His Past Medical History Is Significant For: Past Medical History:  Diagnosis Date   Arthritis    DEGENERATIVE   BPH (benign prostatic hyperplasia) 03/23/2019   Chronic kidney disease (CKD), stage III (moderate) (HCC) 03/23/2019   Chronic pain syndrome    CKD (chronic kidney disease) stage 3, GFR 30-59 ml/min (HCC)    Essential hypertension, benign 03/23/2019   Family history of adverse reaction to anesthesia    mother got headaches and [place]   Hypertension    IBS (irritable bowel syndrome)    Increased heart rate    at times pulse gets fast per pt.   Migraine without aura, without mention of intractable migraine without mention of status migrainosus 10/12/2013   Osteoporosis    Pneumonia    x2   Sleep apnea    mild no mask needed     His Past  Surgical History Is Significant For: Past Surgical History:  Procedure Laterality Date   CATARACT EXTRACTION Bilateral    CERVICAL FUSION     with titaanium plates limited movement  neck dosent go back or side to side   c3-c5  2005   ELBOW FRACTURE SURGERY Right    HEMORRHOID SURGERY     INGUINAL HERNIA REPAIR Bilateral 03/23/2019   Procedure: LAPAROSCOPIC BILATERAL INGUINAL HERNIA REPAIR WITH MESH, BILATERAL TAP BLOCK;  Surgeon: Jesus Kelp, MD;  Location: [facility];  Service: General;  Laterality: Bilateral;   LUMBOSACRAL SURGERY     laminectomy x3 l4, l5   MAXILLARY SINUS LIFT      SINUS EXPLORATION     X3   SPINE SURGERY      His Family History Is Significant For: Family History  Problem Relation Age of Onset   Arthritis Sister    Arthritis Sister    Parkinsonism Brother    Sleep apnea Neg Hx     His Social History Is Significant For: Social History   Socioeconomic History   Marital status: Married    Spouse name: Not on file   Number of children: 2   Years of education: COLLEGE   Highest education level: Not on file  Occupational History   Occupation: RETIRED  Tobacco Use   Smoking status: Former    Packs/day: 0.25    Years: 6.00    Additional pack years: 0.00    Total pack years: 1.50    Types: Cigarettes    Quit date: 1968    Years since quitting: 56.4   Smokeless tobacco: Never  Vaping Use   Vaping Use: Never used  Substance and Sexual Activity   Alcohol use: No   Drug use: No   Sexual activity: Not Currently  Other Topics Concern   Not on file  Social History Narrative   Patient is right handed.   Patient drinks caffeine occassionally   Social Determinants of Health   Financial Resource Strain: Not on file  Food Insecurity: Not on file  Transportation Needs: Not on file  Physical Activity: Not on file  Stress: Not on file  Social Connections: Not on file    His Allergies Are:  Allergies  Allergen Reactions   Ketamine Other (See Comments)    2004 -LSD Trip   Nabumetone Other (See Comments)    Unknown   Naproxen Sodium Other (See Comments)    ulcers   Oxaprozin Other (See Comments)    Unknown   Rofecoxib Other (See Comments)    Unknown   Aspirin Rash   Azithromycin Rash   Erythromycin Base Rash   Nsaids Rash   Prednisone Rash  :   His Current Medications Are:  Outpatient Encounter Medications as of 01/24/2023  Medication Sig   bisoprolol (ZEBETA) 10 MG tablet Take 10 mg by mouth daily.   Ergocalciferol (VITAMIN D2) 10 MCG (400 UNIT) TABS Take by mouth.   lidocaine (XYLOCAINE) 2 % solution Use as directed 1 mL  in the mouth or throat every 6 (six) hours as needed for mouth pain.   mirtazapine (REMERON) 30 MG tablet Take 30 mg by mouth at bedtime.   NEXIUM 40 MG capsule Take 40 mg by mouth daily.   oxyCODONE-acetaminophen (PERCOCET) 10-325 MG per tablet Take 1 tablet by mouth 2 (two) times daily as needed for pain.   promethazine (PHENERGAN) 25 MG tablet Take 25 mg by mouth at bedtime as needed (sleep).    RESTASIS 0.05 %  ophthalmic emulsion Place 1 drop into both eyes 2 (two) times daily.   tamsulosin (FLOMAX) 0.4 MG CAPS capsule Take 0.4 mg by mouth daily.   traMADol (ULTRAM) 50 MG tablet Take 50-100 mg by mouth 4 (four) times daily.   valACYclovir (VALTREX) 1000 MG tablet Take 1 tablet (1,000 mg total) by mouth 3 (three) times daily. (Patient taking differently: Take 1,000 mg by mouth 3 (three) times daily as needed (fever blisters).)   vitamin B-12 (CYANOCOBALAMIN) 1000 MCG tablet Take 1,000 mcg by mouth daily.   No facility-administered encounter medications on file as of 01/24/2023.  :   Review of Systems:  Out of a complete 14 point review of systems, all are reviewed and negative with the exception of these symptoms as listed below:  Review of Systems  Neurological:        Pt here for sleep consult Pt snores,little headaches,hypertension. Pt last sleep study 2004 Pt denies CPAP ,fatigue     ESS FSS    Objective:   Neurological Exam  Physical Exam Physical Examination:   Vitals:   01/24/23 1232  BP: 119/65  Pulse: (!) 57    General Examination: The patient is a very pleasant 82 y.o. male in no acute distress. He appears frail.  Well-groomed.  HEENT: Normocephalic, atraumatic, pupils are equal, round and reactive to light, extraocular tracking is well-preserved, face is symmetric but he had facial surgery to the right.  He has on oropharynx examination, no significant airway crowding, tonsils on the small side, Mallampati class I but redundant soft palate noted.  He has a  significant underbite.  Tongue protrudes centrally and palate elevates symmetrically.    Chest: Clear to auscultation without wheezing, rhonchi or crackles noted.  Heart: S1+S2+0, regular and normal without murmurs, rubs or gallops noted.   Abdomen: Soft, non-tender and non-distended.  Extremities: There is no pitting edema in the distal lower extremities bilaterally.   Skin: Warm and dry without trophic changes noted.   Musculoskeletal: exam reveals significant limitation of neck mobility and increased curvature of the mid and upper back.    Neurologically:  Mental status: The patient is awake, alert and oriented in all 4 spheres. His immediate and remote memory, attention, language skills and fund of knowledge are appropriate. There is no evidence of aphasia, agnosia, apraxia or anomia. Speech is clear with normal prosody and enunciation. Thought process is linear. Mood is normal and affect is normal.  Cranial nerves II - XII are as described above under HEENT exam.  Motor exam: Normal bulk, strength and tone is noted. There is no obvious action or resting tremor.  Fine motor skills and coordination: grossly intact.  Cerebellar testing: No dysmetria or intention tremor. There is no truncal or gait ataxia.  Sensory exam: intact to light touch in the upper and lower extremities.  Gait, station and balance: He stands without major difficulty, posture is with increased forward flexion, shoulder height unequal, walks without a walking aid.   Assessment and Plan:  In summary, Grantlee Slankard is a very pleasant 82 y.o.-year old male with an underlying medical history of paroxysmal A-fib, first-degree AV block, hyperlipidemia, BPH, chronic kidney disease, irritable bowel syndrome, hypertension, migraine headaches, chronic pain (on narcotic pain medication), status post multiple surgeries, osteoporosis, history of pneumonia, and arthritis, who presents for evaluation of his sleep disturbance,  particularly his prior diagnosis of obstructive sleep apnea for which he had treatment with an oral appliance about 20 years ago.  He was recently  diagnosed with paroxysmal A-fib, he is agreeable to pursuing testing again, we mutually agreed to pursue a home sleep test given his sleep difficulty and difficulty maintaining a schedule.   I had a long chat with the patient about my findings and the diagnosis of sleep apnea, particularly OSA, its prognosis and treatment options. We talked about medical/conservative treatments, surgical interventions and non-pharmacological approaches for symptom control. I explained, in particular, the risks and ramifications of untreated moderate to severe OSA, especially with respect to developing cardiovascular disease down the road, including congestive heart failure (CHF), difficult to treat hypertension, cardiac arrhythmias (particularly A-fib), neurovascular complications including TIA, stroke and dementia. Even type 2 diabetes has, in part, been linked to untreated OSA. Symptoms of untreated OSA may include (but may not be limited to) daytime sleepiness, nocturia (i.e. frequent nighttime urination), memory problems, mood irritability and suboptimally controlled or worsening mood disorder such as depression and/or anxiety, lack of energy, lack of motivation, physical discomfort, as well as recurrent headaches, especially morning or nocturnal headaches. We talked about the importance of maintaining a healthy lifestyle and striving for healthy weight. In addition, we talked about the importance of striving for and maintaining good sleep hygiene. I recommended a sleep study at this time. I outlined the differences between a laboratory attended sleep study which is considered more comprehensive and accurate over the option of a home sleep test (HST); the latter may lead to underestimation of sleep disordered breathing in some instances and does not help with diagnosing upper airway  resistance syndrome and is not accurate enough to diagnose primary central sleep apnea typically. I outlined possible surgical and non-surgical treatment options of OSA, including the use of a positive airway pressure (PAP) device (i.e. CPAP, AutoPAP/APAP or BiPAP in certain circumstances), a custom-made dental device (aka oral appliance, which would require a referral to a specialist dentist or orthodontist typically, and is generally speaking not considered for patients with full dentures or edentulous state), upper airway surgical options, such as traditional UPPP (which is not considered a first-line treatment) or the Inspire device (hypoglossal nerve stimulator, which would involve a referral for consultation with an ENT surgeon, after careful selection, following inclusion criteria - also not first-line treatment). I explained the PAP treatment option to the patient in detail, as this is generally considered first-line treatment.  The patient indicated that he would be willing to try PAP therapy, if the need arises. I explained the importance of being compliant with PAP treatment, not only for insurance purposes but primarily to improve patient's symptoms symptoms, and for the patient's long term health benefit, including to reduce His cardiovascular risks longer-term.    We will pick up our discussion about the next steps and treatment options after testing.  We will keep him posted as to the test results by phone call and/or MyChart messaging where possible.  We will plan to follow-up in sleep clinic accordingly as well.  I answered all his questions today and the patient was in agreement.   I encouraged him to call with any interim questions, concerns, problems or updates or email [place] through MyChart.  Generally speaking, sleep test authorizations may take up to 2 weeks, sometimes less, sometimes longer, the patient is encouraged to get in touch with [place] if they do not hear back from the sleep lab staff  directly within the next 2 weeks.  Thank you very much for allowing me to participate in the care of this nice patient. If I can  be of any further assistance to you please do not hesitate to call me at [phone].  Sincerely,   Jesus Foley, MD, PhD

## 2023-01-29 ENCOUNTER — Telehealth: Payer: Self-pay | Admitting: Neurology

## 2023-01-29 NOTE — Telephone Encounter (Signed)
01/29/23 [place] 01/24/23 Medicare/aetna no Berkley Harvey req EE

## 2023-03-27 ENCOUNTER — Ambulatory Visit: Payer: Medicare Other | Admitting: Cardiology

## 2023-03-27 ENCOUNTER — Encounter: Payer: Self-pay | Admitting: Neurology

## 2023-04-23 ENCOUNTER — Ambulatory Visit (INDEPENDENT_AMBULATORY_CARE_PROVIDER_SITE_OTHER): Payer: Medicare Other | Admitting: Neurology

## 2023-04-23 DIAGNOSIS — Z79891 Long term (current) use of opiate analgesic: Secondary | ICD-10-CM

## 2023-04-23 DIAGNOSIS — R0683 Snoring: Secondary | ICD-10-CM

## 2023-04-23 DIAGNOSIS — R351 Nocturia: Secondary | ICD-10-CM

## 2023-04-23 DIAGNOSIS — G479 Sleep disorder, unspecified: Secondary | ICD-10-CM

## 2023-04-23 DIAGNOSIS — G4733 Obstructive sleep apnea (adult) (pediatric): Secondary | ICD-10-CM

## 2023-04-23 DIAGNOSIS — I480 Paroxysmal atrial fibrillation: Secondary | ICD-10-CM

## 2023-04-24 NOTE — Progress Notes (Signed)
See procedure note.

## 2023-04-26 NOTE — Procedures (Signed)
   [facility]  HOME SLEEP TEST (Watch PAT) REPORT  STUDY DATE: 04/24/2023  DOB: 1941-04-02  MRN: [id number]  ORDERING CLINICIAN: Huston Foley, MD, PhD   REFERRING CLINICIAN: Dr. Yates Decamp  CLINICAL INFORMATION/HISTORY: 82 year old male with an underlying medical history of paroxysmal A-fib, first-degree AV block, hyperlipidemia, BPH, chronic kidney disease, irritable bowel syndrome, hypertension, migraine headaches, chronic pain (on narcotic pain medication), status post multiple surgeries, osteoporosis, history of pneumonia, and arthritis, who reports snoring and significant sleep disruption, difficulty initiating and maintaining sleep, as well as a prior diagnosis of sleep apnea.   Epworth sleepiness score: 8/24.  BMI: 20.4 kg/m  FINDINGS:   Sleep Summary:   Total Recording Time (hours, min): 8 hours, 0 min  Total Sleep Time (hours, min):  6 hours, 36 min  Percent REM (%):    19.2%   Respiratory Indices:   Calculated pAHI (per hour):  4.6/hour (utilizing the 4% desaturation criteria for hypopneas per Medicare guidelines)        Central pAHI: 2.5/hour  Oxygen Saturation Statistics:    Oxygen Saturation (%) Mean: 97%   Minimum oxygen saturation (%):                 92%   O2 Saturation Range (%): 92-100%    O2 Saturation (minutes) <=88%: 0 min  Pulse Rate Statistics:   Pulse Mean (bpm):    54/min    Pulse Range (43-88/min)   IMPRESSION: Primary snoring   RECOMMENDATION:  This home sleep test does not demonstrate any significant obstructive or central sleep disordered breathing with a total AHI of less than 5/hour.  His total AHI was 4.6/hour (utilizing the 4% desaturation criteria for hypopneas per Medicare guidelines), O2 nadir was 92%.  Snoring was detected, ranging from mild to loud. Treatment with a positive airway pressure device such as AutoPap or CPAP is not indicated based on this test. Snoring may improve with avoidance of the supine  sleep position.  The patient has previously tried an oral appliance without success. Other causes of the patient's symptoms, including circadian rhythm disturbances, an underlying mood disorder, medication effect and/or an underlying medical problem cannot be ruled out based on this test. Clinical correlation is recommended.  The patient should be cautioned not to drive, work at heights, or operate dangerous or heavy equipment when tired or sleepy. Review and reiteration of good sleep hygiene measures should be pursued with any patient. The patient will be advised to follow up with his referring provider, who will be notified of the test results.   I certify that I have reviewed the raw data recording prior to the issuance of this report in accordance with the standards of the American Academy of Sleep Medicine (AASM).  INTERPRETING PHYSICIAN:   Huston Foley, MD, PhD Medical Director, [facility]) Diplomat, ABPN (Neurology and Sleep)   [facility] [address], [address] [place], [place] [address] [phone]
